# Patient Record
Sex: Female | Born: 1950 | Hispanic: No | Marital: Married | Smoking: Former smoker
Health system: Southern US, Community
[De-identification: ages and names within clinical notes are randomized; demographics above are authoritative.]

## PROBLEM LIST (undated history)

## (undated) DIAGNOSIS — I671 Cerebral aneurysm, nonruptured: Secondary | ICD-10-CM

## (undated) DIAGNOSIS — I10 Essential (primary) hypertension: Secondary | ICD-10-CM

## (undated) DIAGNOSIS — R011 Cardiac murmur, unspecified: Secondary | ICD-10-CM

## (undated) DIAGNOSIS — I059 Rheumatic mitral valve disease, unspecified: Secondary | ICD-10-CM

## (undated) DIAGNOSIS — G4730 Sleep apnea, unspecified: Secondary | ICD-10-CM

## (undated) HISTORY — PX: FRACTURE SURGERY: SHX138

## (undated) HISTORY — PX: OTHER SURGICAL HISTORY: SHX169

## (undated) HISTORY — DX: Essential (primary) hypertension: I10

## (undated) HISTORY — DX: Rheumatic mitral valve disease, unspecified: I05.9

## (undated) HISTORY — DX: Cerebral aneurysm, nonruptured: I67.1

## (undated) HISTORY — DX: Cardiac murmur, unspecified: R01.1

## (undated) HISTORY — DX: Sleep apnea, unspecified: G47.30

---

## 1998-02-21 ENCOUNTER — Other Ambulatory Visit: Admission: RE | Admit: 1998-02-21 | Discharge: 1998-02-21 | Payer: Self-pay | Admitting: Obstetrics and Gynecology

## 2001-03-28 ENCOUNTER — Other Ambulatory Visit: Admission: RE | Admit: 2001-03-28 | Discharge: 2001-03-28 | Payer: Self-pay | Admitting: Obstetrics and Gynecology

## 2002-07-27 ENCOUNTER — Other Ambulatory Visit: Admission: RE | Admit: 2002-07-27 | Discharge: 2002-07-27 | Payer: Self-pay | Admitting: Obstetrics and Gynecology

## 2003-08-07 ENCOUNTER — Other Ambulatory Visit: Admission: RE | Admit: 2003-08-07 | Discharge: 2003-08-07 | Payer: Self-pay | Admitting: Obstetrics and Gynecology

## 2004-11-26 ENCOUNTER — Other Ambulatory Visit: Admission: RE | Admit: 2004-11-26 | Discharge: 2004-11-26 | Payer: Self-pay | Admitting: Obstetrics and Gynecology

## 2007-02-16 ENCOUNTER — Other Ambulatory Visit: Admission: RE | Admit: 2007-02-16 | Discharge: 2007-02-16 | Payer: Self-pay | Admitting: Obstetrics and Gynecology

## 2010-04-08 ENCOUNTER — Inpatient Hospital Stay: Payer: Self-pay | Admitting: Unknown Physician Specialty

## 2010-06-10 ENCOUNTER — Other Ambulatory Visit: Payer: Self-pay | Admitting: Surgery

## 2010-07-22 ENCOUNTER — Ambulatory Visit: Payer: Self-pay | Admitting: Unknown Physician Specialty

## 2010-07-24 ENCOUNTER — Ambulatory Visit: Payer: Self-pay | Admitting: Unknown Physician Specialty

## 2011-12-07 IMAGING — CR DG CHEST 2V
1 series · 2 of 2 positions shown · non-contrast
Comparison: none

REASON FOR EXAM: htn
COMMENTS:

PROCEDURE:     DXR - DXR CHEST PA (OR AP) AND LATERAL  - July 22, 2010  [DATE]
RESULT:     The lung fields are clear. The heart, mediastinal and osseous
structures show no significant abnormalities.

[Series 1: view not recorded · 0.17mm/px · 2 of 2 slices shown]
[im 1/2]
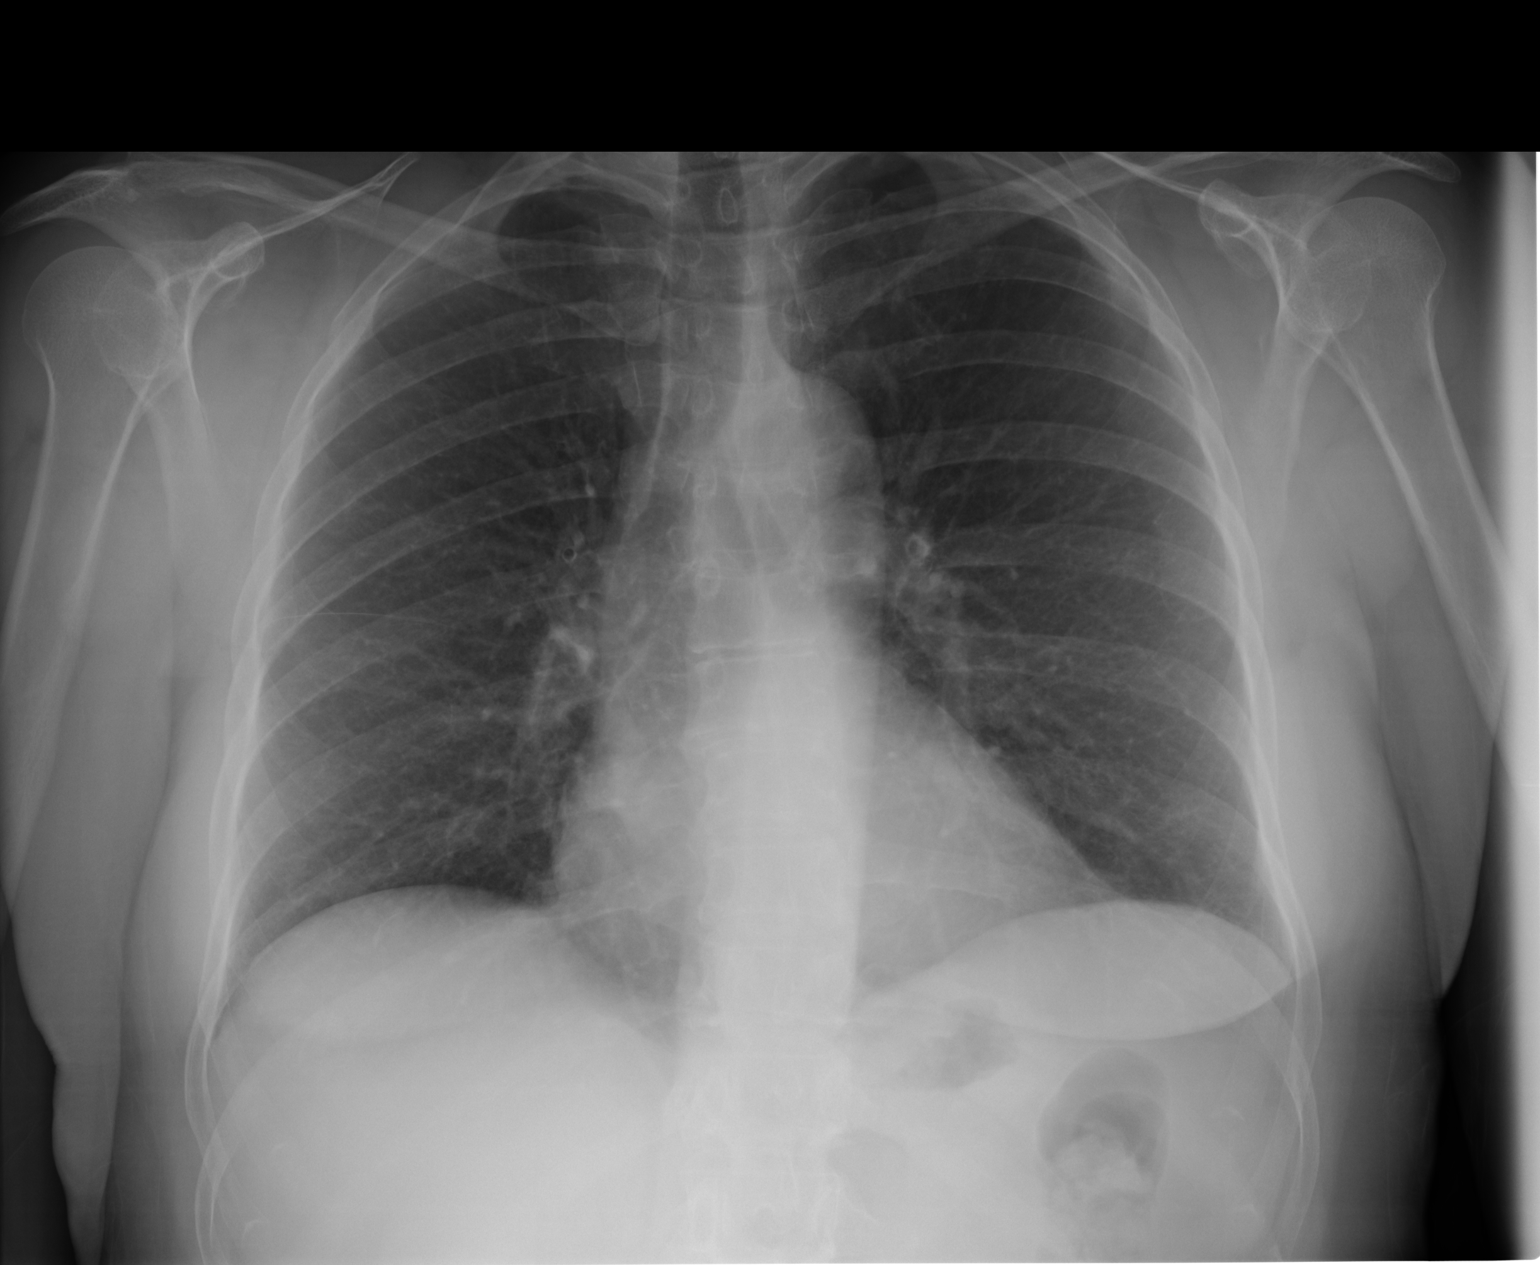
[im 2/2]
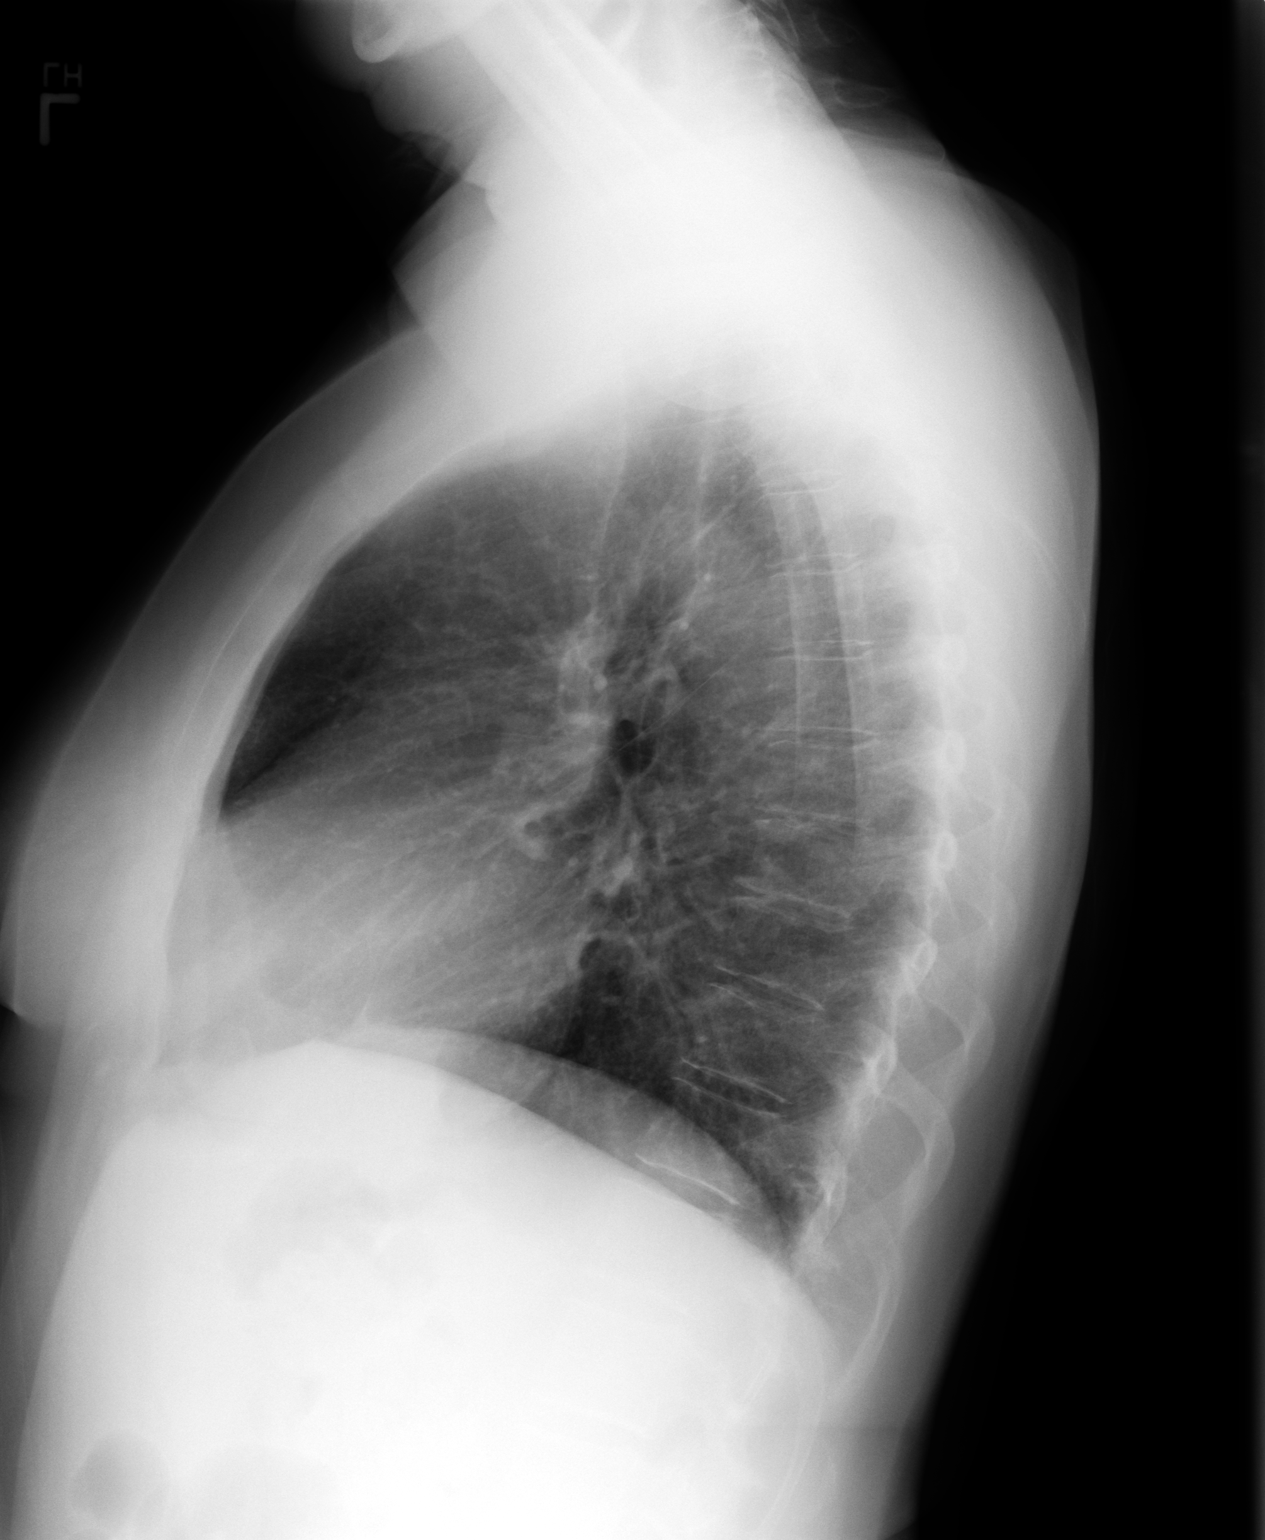

[2 of 2 positions shown; findings below may reference images not displayed]

IMPRESSION: No significant abnormalities are noted.

## 2015-01-23 DIAGNOSIS — I341 Nonrheumatic mitral (valve) prolapse: Secondary | ICD-10-CM | POA: Insufficient documentation

## 2015-01-23 DIAGNOSIS — I10 Essential (primary) hypertension: Secondary | ICD-10-CM | POA: Insufficient documentation

## 2015-01-28 DIAGNOSIS — D126 Benign neoplasm of colon, unspecified: Secondary | ICD-10-CM | POA: Insufficient documentation

## 2015-04-15 ENCOUNTER — Encounter: Admission: RE | Payer: Self-pay | Source: Ambulatory Visit

## 2015-04-15 ENCOUNTER — Ambulatory Visit
Admission: RE | Admit: 2015-04-15 | Payer: No Typology Code available for payment source | Source: Ambulatory Visit | Admitting: Unknown Physician Specialty

## 2015-04-15 SURGERY — COLONOSCOPY WITH PROPOFOL
Anesthesia: General

## 2018-09-08 DIAGNOSIS — C4492 Squamous cell carcinoma of skin, unspecified: Secondary | ICD-10-CM

## 2018-09-08 DIAGNOSIS — C4490 Unspecified malignant neoplasm of skin, unspecified: Secondary | ICD-10-CM

## 2018-09-08 HISTORY — DX: Squamous cell carcinoma of skin, unspecified: C44.92

## 2018-09-08 HISTORY — DX: Unspecified malignant neoplasm of skin, unspecified: C44.90

## 2019-07-24 ENCOUNTER — Other Ambulatory Visit: Payer: Self-pay

## 2019-07-24 ENCOUNTER — Encounter: Payer: Self-pay | Admitting: Dermatology

## 2019-07-24 ENCOUNTER — Ambulatory Visit: Payer: Medicare Other | Admitting: Dermatology

## 2019-07-24 DIAGNOSIS — Z85828 Personal history of other malignant neoplasm of skin: Secondary | ICD-10-CM | POA: Diagnosis not present

## 2019-07-24 DIAGNOSIS — L578 Other skin changes due to chronic exposure to nonionizing radiation: Secondary | ICD-10-CM | POA: Diagnosis not present

## 2019-07-24 DIAGNOSIS — L570 Actinic keratosis: Secondary | ICD-10-CM

## 2019-07-24 DIAGNOSIS — L814 Other melanin hyperpigmentation: Secondary | ICD-10-CM

## 2019-07-24 DIAGNOSIS — L820 Inflamed seborrheic keratosis: Secondary | ICD-10-CM | POA: Diagnosis not present

## 2019-07-24 DIAGNOSIS — L821 Other seborrheic keratosis: Secondary | ICD-10-CM | POA: Diagnosis not present

## 2019-07-24 NOTE — Progress Notes (Signed)
   Follow-Up Visit   Subjective  Diana Hart is a 69 y.o. female who presents for the following: recheck AK's (face, hands, chest - pt thinks she may still have lesions on her face) and recheck ISK's (face, hands, legs, arms - hands have improved per pt).  The following portions of the chart were reviewed this encounter and updated as appropriate:     Review of Systems: No other skin or systemic complaints.  Objective  Well appearing patient in no apparent distress; mood and affect are within normal limits.  A focused examination was performed including face, chest, arms, and hands. Relevant physical exam findings are noted in the Assessment and Plan.  Objective  L mandible x 1, R hand x 5, chest x 1, legs x 10 (17): Erythematous thin papules/macules with gritty scale.   Objective  Face, extremities: Diffuse scaly erythematous macules with underlying dyspigmentation.   Objective  L ant distal thigh: Well healed scar with no evidence of recurrence, no lymphadenopathy.   Objective  R sup forehead x 1, L temple x 1, L arm and hand x 14, R forearm x 4, chest x 4 (24): Erythematous keratotic or waxy stuck-on papule or plaque.   Objective  Face, trunk, extremities: Scattered tan macules.   Objective  Face, trunk, extremities: Stuck-on, waxy, tan-brown papule or plaque --Discussed benign etiology and prognosis.   Assessment & Plan  AK (actinic keratosis) (17) L mandible x 1, R hand x 5, chest x 1, legs x 10  Destruction of lesion - L mandible x 1, R hand x 5, chest x 1, legs x 10 Complexity: simple   Destruction method: cryotherapy   Informed consent: discussed and consent obtained   Timeout:  patient name, date of birth, surgical site, and procedure verified Lesion destroyed using liquid nitrogen: Yes   Region frozen until ice ball extended beyond lesion: Yes   Outcome: patient tolerated procedure well with no complications   Post-procedure details: wound care  instructions given    Actinic skin damage Face, extremities  Recommend broad spectrum SPF and photoprotection   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 Observe for recurrence.  Recommend regular skin exams, sunscreen use, and photoprotection.   Inflamed seborrheic keratosis (24) R sup forehead x 1, L temple x 1, L arm and hand x 14, R forearm x 4, chest x 4  Destruction of lesion - R sup forehead x 1, L temple x 1, L arm and hand x 14, R forearm x 4, chest x 4 Complexity: simple   Destruction method: cryotherapy   Informed consent: discussed and consent obtained   Timeout:  patient name, date of birth, surgical site, and procedure verified Lesion destroyed using liquid nitrogen: Yes   Region frozen until ice ball extended beyond lesion: Yes   Outcome: patient tolerated procedure well with no complications   Post-procedure details: wound care instructions given    Lentigines Face, trunk, extremities  Benign, observe.  Seborrheic keratosis Face, trunk, extremities  Benign, observe.  Return in about 6 months (around 01/24/2020) for F/U appt. - recheck AK's and ISK's.   Tanja Port, MD, am acting as scribe for Sarina Ser, MD .

## 2019-07-24 NOTE — Progress Notes (Signed)
   Follow-Up Visit   Subjective  Diana Hart is a 69 y.o. female who presents for the following: recheck AK's (face, hands, chest - pt thinks she may still have lesions on her face) and recheck ISK's (face, hands, legs, arms - hands have improved per pt).  The following portions of the chart were reviewed this encounter and updated as appropriate:     Review of Systems: No other skin or systemic complaints.  Objective  Well appearing patient in no apparent distress; mood and affect are within normal limits.  A focused examination was performed including face, arms, hands, chest, and legs. Relevant physical exam findings are noted in the Assessment and Plan.  Objective  L mandible x 1, R hand x 5, chest x 1, legs x 10 (17): Erythematous thin papules/macules with gritty scale.   Objective  Face, extremities: Diffuse scaly erythematous macules with underlying dyspigmentation.   Objective  L ant distal thigh: Well healed scar with no evidence of recurrence, no lymphadenopathy.   Objective  R sup forehead x 1, L temple x 1, L arm and hand x 14, R forearm x 4, chest x 4 (24): Erythematous keratotic or waxy stuck-on papule or plaque.   Objective  Face, trunk, extremities: Scattered tan macules.   Objective  Face, trunk, extremities: Stuck-on, waxy, tan-brown papule or plaque --Discussed benign etiology and prognosis.   Assessment & Plan  AK (actinic keratosis) (17) L mandible x 1, R hand x 5, chest x 1, legs x 10  Destruction of lesion - L mandible x 1, R hand x 5, chest x 1, legs x 10 Complexity: simple   Destruction method: cryotherapy   Informed consent: discussed and consent obtained   Timeout:  patient name, date of birth, surgical site, and procedure verified Lesion destroyed using liquid nitrogen: Yes   Region frozen until ice ball extended beyond lesion: Yes   Outcome: patient tolerated procedure well with no complications   Post-procedure details: wound care  instructions given    Actinic skin damage Face, extremities  Recommend broad spectrum SPF and photoprotection   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 Observe for recurrence.  Recommend regular skin exams, sunscreen use, and photoprotection.   Inflamed seborrheic keratosis (24) R sup forehead x 1, L temple x 1, L arm and hand x 14, R forearm x 4, chest x 4  Destruction of lesion - R sup forehead x 1, L temple x 1, L arm and hand x 14, R forearm x 4, chest x 4 Complexity: simple   Destruction method: cryotherapy   Informed consent: discussed and consent obtained   Timeout:  patient name, date of birth, surgical site, and procedure verified Lesion destroyed using liquid nitrogen: Yes   Region frozen until ice ball extended beyond lesion: Yes   Outcome: patient tolerated procedure well with no complications   Post-procedure details: wound care instructions given    Lentigines Face, trunk, extremities  Benign, observe.  Seborrheic keratosis Face, trunk, extremities  Benign, observe.  Return in about 6 months (around 01/24/2020) for F/U appt. - recheck AK's and ISK's.   Tanya Nones, CMA, am acting as scribe for Sarina Ser, MD .

## 2020-01-17 ENCOUNTER — Ambulatory Visit: Payer: Medicare Other | Admitting: Dermatology

## 2020-04-03 ENCOUNTER — Other Ambulatory Visit: Payer: Self-pay

## 2020-04-03 ENCOUNTER — Ambulatory Visit: Payer: Medicare Other | Admitting: Dermatology

## 2020-04-03 DIAGNOSIS — L570 Actinic keratosis: Secondary | ICD-10-CM | POA: Diagnosis not present

## 2020-04-03 DIAGNOSIS — L820 Inflamed seborrheic keratosis: Secondary | ICD-10-CM

## 2020-04-03 DIAGNOSIS — L720 Epidermal cyst: Secondary | ICD-10-CM | POA: Diagnosis not present

## 2020-04-03 DIAGNOSIS — L821 Other seborrheic keratosis: Secondary | ICD-10-CM | POA: Diagnosis not present

## 2020-04-03 DIAGNOSIS — L578 Other skin changes due to chronic exposure to nonionizing radiation: Secondary | ICD-10-CM | POA: Diagnosis not present

## 2020-04-03 NOTE — Progress Notes (Signed)
   Follow-Up Visit   Subjective  Diana Hart is a 69 y.o. female who presents for the following: recheck AK (x 17 on the L mandible, R hand, chest, and legs - check for any persistent skin lesions) and recheck ISK (x 24 on the R sup forehead, L temple, L arm and hand, R forearm, and chest - check for any persistent skin lesions).  The following portions of the chart were reviewed this encounter and updated as appropriate:  Allergies  Meds  Problems  Med Hx  Surg Hx  Fam Hx     Review of Systems:  No other skin or systemic complaints except as noted in HPI or Assessment and Plan.  Objective  Well appearing patient in no apparent distress; mood and affect are within normal limits.  A focused examination was performed including face, neck, chest and back and the trunk and extremities. Relevant physical exam findings are noted in the Assessment and Plan.  Objective  Face and hands x 16 (16): Erythematous thin papules/macules with gritty scale.   Objective  Back x 2 (2): Erythematous keratotic or waxy stuck-on papule or plaque.   Objective  Face: Smooth white papule(s).   Assessment & Plan  AK (actinic keratosis) (16) Face and hands x 16  Destruction of lesion - Face and hands x 16 Complexity: simple   Destruction method: cryotherapy   Informed consent: discussed and consent obtained   Timeout:  patient name, date of birth, surgical site, and procedure verified Lesion destroyed using liquid nitrogen: Yes   Region frozen until ice ball extended beyond lesion: Yes   Outcome: patient tolerated procedure well with no complications   Post-procedure details: wound care instructions given    Inflamed seborrheic keratosis (2) Back x 2  Destruction of lesion - Back x 2 Complexity: simple   Destruction method: cryotherapy   Informed consent: discussed and consent obtained   Timeout:  patient name, date of birth, surgical site, and procedure verified Lesion destroyed using  liquid nitrogen: Yes   Region frozen until ice ball extended beyond lesion: Yes   Outcome: patient tolerated procedure well with no complications   Post-procedure details: wound care instructions given    Milia Face  Samples given of Altreno lotion and Epiduo Forte - apply to aa's QHS.    Seborrheic Keratoses - Stuck-on, waxy, tan-brown papules and plaques  - Discussed benign etiology and prognosis. - Observe - Call for any changes  Actinic Damage - chronic, secondary to cumulative UV radiation exposure/sun exposure over time - diffuse scaly erythematous macules with underlying dyspigmentation - Recommend daily broad spectrum sunscreen SPF 30+ to sun-exposed areas, reapply every 2 hours as needed.  - Call for new or changing lesions.  Return in about 6 months (around 10/02/2020) for AK and ISK recheck .  Luther Redo, CMA, am acting as scribe for Sarina Ser, MD .  Documentation: I have reviewed the above documentation for accuracy and completeness, and I agree with the above.  Sarina Ser, MD

## 2020-04-08 ENCOUNTER — Encounter: Payer: Self-pay | Admitting: Dermatology

## 2020-10-10 ENCOUNTER — Ambulatory Visit: Payer: Medicare Other | Admitting: Dermatology

## 2021-01-29 ENCOUNTER — Other Ambulatory Visit: Payer: Self-pay

## 2021-01-29 ENCOUNTER — Ambulatory Visit: Payer: Medicare Other | Admitting: Dermatology

## 2021-01-29 DIAGNOSIS — L820 Inflamed seborrheic keratosis: Secondary | ICD-10-CM

## 2021-01-29 DIAGNOSIS — L570 Actinic keratosis: Secondary | ICD-10-CM | POA: Diagnosis not present

## 2021-01-29 DIAGNOSIS — L578 Other skin changes due to chronic exposure to nonionizing radiation: Secondary | ICD-10-CM | POA: Diagnosis not present

## 2021-01-29 MED ORDER — FLUOROURACIL 5 % EX CREA: TOPICAL_CREAM | Freq: Two times a day (BID) | CUTANEOUS | 1 refills | Status: DC

## 2021-01-29 NOTE — Patient Instructions (Addendum)

## 2021-01-29 NOTE — Progress Notes (Signed)
Follow-Up Visit   Subjective  Diana Hart is a 70 y.o. female who presents for the following: Actinic Keratosis (9 month follow up of face and hands treated with LN2).  The following portions of the chart were reviewed this encounter and updated as appropriate:   Allergies  Meds  Problems  Med Hx  Surg Hx  Fam Hx     Review of Systems:  No other skin or systemic complaints except as noted in HPI or Assessment and Plan.  Objective  Well appearing patient in no apparent distress; mood and affect are within normal limits.  A focused examination was performed including face, arms. Relevant physical exam findings are noted in the Assessment and Plan.  Right face x 4, right arm/hand x 11, left arm/hand x 5 (20) Erythematous thin papules/macules with gritty scale.   Left arm x 2 (2) Erythematous keratotic or waxy stuck-on papule or plaque.    Assessment & Plan   AK (actinic keratosis) (20) Right face x 4, right arm/hand x 11, left arm/hand x 5  Actinic Damage - Severe, confluent actinic changes with pre-cancerous actinic keratoses  - Severe, chronic, not at goal, secondary to cumulative UV radiation exposure over time - diffuse scaly erythematous macules and papules with underlying dyspigmentation - Discussed Prescription "Field Treatment" for Severe, Chronic Confluent Actinic Changes with Pre-Cancerous Actinic Keratoses Field treatment involves treatment of an entire area of skin that has confluent Actinic Changes (Sun/ Ultraviolet light damage) and PreCancerous Actinic Keratoses by method of PhotoDynamic Therapy (PDT) and/or prescription Topical Chemotherapy agents such as 5-fluorouracil, 5-fluorouracil/calcipotriene, and/or imiquimod.  The purpose is to decrease the number of clinically evident and subclinical PreCancerous lesions to prevent progression to development of skin cancer by chemically destroying early precancer changes that may or may not be visible.  It has been  shown to reduce the risk of developing skin cancer in the treated area. As a result of treatment, redness, scaling, crusting, and open sores may occur during treatment course. One or more than one of these methods may be used and may have to be used several times to control, suppress and eliminate the PreCancerous changes. Discussed treatment course, expected reaction, and possible side effects. - Recommend daily broad spectrum sunscreen SPF 30+ to sun-exposed areas, reapply every 2 hours as needed.  - Staying in the shade or wearing long sleeves, sun glasses (UVA+UVB protection) and wide brim hats (4-inch brim around the entire circumference of the hat) are also recommended. - Call for new or changing lesions.  Start Fluorouracil 5% mixed with Calcipotriene cream bid x 7 days - sent to [facility] of lesion - Right face x 4, right arm/hand x 11, left arm/hand x 5 Complexity: simple   Destruction method: cryotherapy   Informed consent: discussed and consent obtained   Timeout:  patient name, date of birth, surgical site, and procedure verified Lesion destroyed using liquid nitrogen: Yes   Region frozen until ice ball extended beyond lesion: Yes   Outcome: patient tolerated procedure well with no complications   Post-procedure details: wound care instructions given    fluorouracil (EFUDEX) 5 % cream - Right face x 4, right arm/hand x 11, left arm/hand x 5 Apply topically 2 (two) times daily.  Inflamed seborrheic keratosis Left arm x 2  Destruction of lesion - Left arm x 2 Complexity: simple   Destruction method: cryotherapy   Informed consent: discussed and consent obtained   Timeout:  patient name, date of  birth, surgical site, and procedure verified Lesion destroyed using liquid nitrogen: Yes   Region frozen until ice ball extended beyond lesion: Yes   Outcome: patient tolerated procedure well with no complications   Post-procedure details: wound care instructions  given    Return in about 6 months (around 07/29/2021).  I, Ashok Cordia, CMA, am acting as scribe for Sarina Ser, MD . Documentation: I have reviewed the above documentation for accuracy and completeness, and I agree with the above.  Sarina Ser, MD

## 2021-01-31 ENCOUNTER — Encounter: Payer: Self-pay | Admitting: Dermatology

## 2021-08-06 ENCOUNTER — Ambulatory Visit: Payer: Medicare Other | Admitting: Dermatology

## 2021-12-11 ENCOUNTER — Ambulatory Visit: Payer: Medicare Other | Admitting: Dermatology

## 2021-12-22 ENCOUNTER — Ambulatory Visit: Payer: Medicare Other | Admitting: Dermatology

## 2021-12-22 DIAGNOSIS — L570 Actinic keratosis: Secondary | ICD-10-CM

## 2021-12-22 DIAGNOSIS — L578 Other skin changes due to chronic exposure to nonionizing radiation: Secondary | ICD-10-CM

## 2021-12-22 DIAGNOSIS — Z79899 Other long term (current) drug therapy: Secondary | ICD-10-CM

## 2021-12-22 DIAGNOSIS — D229 Melanocytic nevi, unspecified: Secondary | ICD-10-CM

## 2021-12-22 DIAGNOSIS — Z1283 Encounter for screening for malignant neoplasm of skin: Secondary | ICD-10-CM | POA: Diagnosis not present

## 2021-12-22 DIAGNOSIS — D1800 Hemangioma unspecified site: Secondary | ICD-10-CM

## 2021-12-22 DIAGNOSIS — Z8589 Personal history of malignant neoplasm of other organs and systems: Secondary | ICD-10-CM

## 2021-12-22 DIAGNOSIS — L821 Other seborrheic keratosis: Secondary | ICD-10-CM

## 2021-12-22 DIAGNOSIS — Z85828 Personal history of other malignant neoplasm of skin: Secondary | ICD-10-CM | POA: Diagnosis not present

## 2021-12-22 DIAGNOSIS — C44729 Squamous cell carcinoma of skin of left lower limb, including hip: Secondary | ICD-10-CM

## 2021-12-22 DIAGNOSIS — D485 Neoplasm of uncertain behavior of skin: Secondary | ICD-10-CM

## 2021-12-22 DIAGNOSIS — L814 Other melanin hyperpigmentation: Secondary | ICD-10-CM

## 2021-12-22 NOTE — Patient Instructions (Addendum)
Wound Care Instructions  Cleanse wound gently with soap and water once a day then pat dry with clean gauze. Apply a thin coat of Petrolatum (petroleum jelly, "Vaseline") over the wound (unless you have an allergy to this). We recommend that you use a new, sterile tube of Vaseline. Do not pick or remove scabs. Do not remove the yellow or white "healing tissue" from the base of the wound.  Cover the wound with fresh, clean, nonstick gauze and secure with paper tape. You may use Band-Aids in place of gauze and tape if the wound is small enough, but would recommend trimming much of the tape off as there is often too much. Sometimes Band-Aids can irritate the skin.  You should call the office for your biopsy report after 1 week if you have not already been contacted.  If you experience any problems, such as abnormal amounts of bleeding, swelling, significant bruising, significant pain, or evidence of infection, please call the office immediately.  FOR ADULT SURGERY PATIENTS: If you need something for pain relief you may take 1 extra strength Tylenol (acetaminophen) AND 2 Ibuprofen (200mg each) together every 4 hours as needed for pain. (do not take these if you are allergic to them or if you have a reason you should not take them.) Typically, you may only need pain medication for 1 to 3 days.     Due to recent changes in healthcare laws, you may see results of your pathology and/or laboratory studies on MyChart before the doctors have had a chance to review them. We understand that in some cases there may be results that are confusing or concerning to you. Please understand that not all results are received at the same time and often the doctors may need to interpret multiple results in order to provide you with the best plan of care or course of treatment. Therefore, we ask that you please give us 2 business days to thoroughly review all your results before contacting the office for clarification. Should  we see a critical lab result, you will be contacted sooner.   If You Need Anything After Your Visit  If you have any questions or concerns for your doctor, please call our main line at [phone] and press option 4 to reach your doctor's medical assistant. If no one answers, please leave a voicemail as directed and we will return your call as soon as possible. Messages left after 4 pm will be answered the following business day.   You may also send us a message via MyChart. We typically respond to MyChart messages within 1-2 business days.  For prescription refills, please ask your pharmacy to contact our office. Our fax number is [phone].  If you have an urgent issue when the clinic is closed that cannot wait until the next business day, you can page your doctor at the number below.    Please note that while we do our best to be available for urgent issues outside of office hours, we are not available 24/7.   If you have an urgent issue and are unable to reach us, you may choose to seek medical care at your doctor's office, retail clinic, urgent care center, or emergency room.  If you have a medical emergency, please immediately call 911 or go to the emergency department.  Pager Numbers  - Dr. Kowalski: [phone]  - Dr. Moye: [phone]  - Dr. Stewart: [phone]  In the event of inclement weather, please call our main line at   [phone] for an update on the status of any delays or closures.  Dermatology Medication Tips: Please keep the boxes that topical medications come in in order to help keep track of the instructions about where and how to use these. Pharmacies typically print the medication instructions only on the boxes and not directly on the medication tubes.   If your medication is too expensive, please contact our office at [phone] option 4 or send us a message through MyChart.   We are unable to tell what your co-pay for medications will be in  advance as this is different depending on your insurance coverage. However, we may be able to find a substitute medication at lower cost or fill out paperwork to get insurance to cover a needed medication.   If a prior authorization is required to get your medication covered by your insurance company, please allow us 1-2 business days to complete this process.  Drug prices often vary depending on where the prescription is filled and some pharmacies may offer cheaper prices.  The website www.goodrx.com contains coupons for medications through different pharmacies. The prices here do not account for what the cost may be with help from insurance (it may be cheaper with your insurance), but the website can give you the price if you did not use any insurance.  - You can print the associated coupon and take it with your prescription to the pharmacy.  - You may also stop by our office during regular business hours and pick up a GoodRx coupon card.  - If you need your prescription sent electronically to a different pharmacy, notify our office through [facility] MyChart or by phone at [phone] option 4.     Si Usted Necesita Algo Despus de Su Visita  Tambin puede enviarnos un mensaje a travs de MyChart. Por lo general respondemos a los mensajes de MyChart en el transcurso de 1 a 2 das hbiles.  Para renovar recetas, por favor pida a su farmacia que se ponga en contacto con nuestra oficina. Nuestro nmero de fax es el [phone].  Si tiene un asunto urgente cuando la clnica est cerrada y que no puede esperar hasta el siguiente da hbil, puede llamar/localizar a su doctor(a) al nmero que aparece a continuacin.   Por favor, tenga en cuenta que aunque hacemos todo lo posible para estar disponibles para asuntos urgentes fuera del horario de oficina, no estamos disponibles las 24 horas del da, los 7 das de la semana.   Si tiene un problema urgente y no puede comunicarse con nosotros, puede  optar por buscar atencin mdica  en el consultorio de su doctor(a), en una clnica privada, en un centro de atencin urgente o en una sala de emergencias.  Si tiene una emergencia mdica, por favor llame inmediatamente al 911 o vaya a la sala de emergencias.  Nmeros de bper  - Dr. Kowalski: [phone]  - Dra. Moye: [phone]  - Dra. Stewart: [phone]  En caso de inclemencias del tiempo, por favor llame a nuestra lnea principal al [phone] para una actualizacin sobre el estado de cualquier retraso o cierre.  Consejos para la medicacin en dermatologa: Por favor, guarde las cajas en las que vienen los medicamentos de uso tpico para ayudarle a seguir las instrucciones sobre dnde y cmo usarlos. Las farmacias generalmente imprimen las instrucciones del medicamento slo en las cajas y no directamente en los tubos del medicamento.   Si su medicamento es muy caro, por favor, pngase en contacto con   nuestra oficina llamando al [phone] y presione la opcin 4 o envenos un mensaje a travs de MyChart.   No podemos decirle cul ser su copago por los medicamentos por adelantado ya que esto es diferente dependiendo de la cobertura de su seguro. Sin embargo, es posible que podamos encontrar un medicamento sustituto a menor costo o llenar un formulario para que el seguro cubra el medicamento que se considera necesario.   Si se requiere una autorizacin previa para que su compaa de seguros cubra su medicamento, por favor permtanos de 1 a 2 das hbiles para completar este proceso.  Los precios de los medicamentos varan con frecuencia dependiendo del lugar de dnde se surte la receta y alguna farmacias pueden ofrecer precios ms baratos.  El sitio web www.goodrx.com tiene cupones para medicamentos de diferentes farmacias. Los precios aqu no tienen en cuenta lo que podra costar con la ayuda del seguro (puede ser ms barato con su seguro), pero el sitio web puede darle el  precio si no utiliz ningn seguro.  - Puede imprimir el cupn correspondiente y llevarlo con su receta a la farmacia.  - Tambin puede pasar por nuestra oficina durante el horario de atencin regular y recoger una tarjeta de cupones de GoodRx.  - Si necesita que su receta se enve electrnicamente a una farmacia diferente, informe a nuestra oficina a travs de MyChart de [facility] o por telfono llamando al [phone] y presione la opcin 4.  

## 2021-12-22 NOTE — Progress Notes (Signed)
Follow-Up Visit   Subjective  Diana Hart is a 71 y.o. female who presents for the following: Annual Exam (Hx AK's, SCC). The patient presents for Total-Body Skin Exam (TBSE) for skin cancer screening and mole check.  The patient has spots, moles and lesions to be evaluated, some may be new or changing.  The following portions of the chart were reviewed this encounter and updated as appropriate:   Tobacco  Allergies  Meds  Problems  Med Hx  Surg Hx  Fam Hx     Review of Systems:  No other skin or systemic complaints except as noted in HPI or Assessment and Plan.  Objective  Well appearing patient in no apparent distress; mood and affect are within normal limits.  A full examination was performed including scalp, head, eyes, ears, nose, lips, neck, chest, axillae, abdomen, back, buttocks, bilateral upper extremities, bilateral lower extremities, hands, feet, fingers, toes, fingernails, and toenails. All findings within normal limits unless otherwise noted below.  L temple, hands, ears x 17 (17) Erythematous thin papules/macules with gritty scale.   L mid med calf 0.5 cm firm pink papule.   Assessment & Plan  AK (actinic keratosis) (17) L temple, hands, ears x 17  Destruction of lesion - L temple, hands, ears x 17 Complexity: simple   Destruction method: cryotherapy   Informed consent: discussed and consent obtained   Timeout:  patient name, date of birth, surgical site, and procedure verified Lesion destroyed using liquid nitrogen: Yes   Region frozen until ice ball extended beyond lesion: Yes   Outcome: patient tolerated procedure well with no complications   Post-procedure details: wound care instructions given    Actinic Damage - Severe, confluent actinic changes with pre-cancerous actinic keratoses  - Severe, chronic, not at goal, secondary to cumulative UV radiation exposure over time - diffuse scaly erythematous macules and papules with underlying  dyspigmentation - Discussed Prescription "Field Treatment" for Severe, Chronic Confluent Actinic Changes with Pre-Cancerous Actinic Keratoses Field treatment involves treatment of an entire area of skin that has confluent Actinic Changes (Sun/ Ultraviolet light damage) and PreCancerous Actinic Keratoses by method of PhotoDynamic Therapy (PDT) and/or prescription Topical Chemotherapy agents such as 5-fluorouracil, 5-fluorouracil/calcipotriene, and/or imiquimod.  The purpose is to decrease the number of clinically evident and subclinical PreCancerous lesions to prevent progression to development of skin cancer by chemically destroying early precancer changes that may or may not be visible.  It has been shown to reduce the risk of developing skin cancer in the treated area. As a result of treatment, redness, scaling, crusting, and open sores may occur during treatment course. One or more than one of these methods may be used and may have to be used several times to control, suppress and eliminate the PreCancerous changes. Discussed treatment course, expected reaction, and possible side effects. - Recommend daily broad spectrum sunscreen SPF 30+ to sun-exposed areas, reapply every 2 hours as needed.  - Staying in the shade or wearing long sleeves, sun glasses (UVA+UVB protection) and wide brim hats (4-inch brim around the entire circumference of the hat) are also recommended. - Call for new or changing lesions. 5-FU/Calcipotriene bid arms and hands this Fall.  Neoplasm of uncertain behavior of skin L mid med calf Epidermal / dermal shaving  Lesion diameter (cm):  0.5 Informed consent: discussed and consent obtained   Timeout: patient name, date of birth, surgical site, and procedure verified   Procedure prep:  Patient was prepped and draped in usual  sterile fashion Prep type:  Isopropyl alcohol Anesthesia: the lesion was anesthetized in a standard fashion   Anesthetic:  1% lidocaine w/ epinephrine  1-100,000 buffered w/ 8.4% NaHCO3 Instrument used: flexible razor blade   Hemostasis achieved with: pressure, aluminum chloride and electrodesiccation   Outcome: patient tolerated procedure well   Post-procedure details: sterile dressing applied and wound care instructions given   Dressing type: bandage and petrolatum    Specimen 1 - Surgical pathology Differential Diagnosis: D48.5 r/o dermatofibroma Check Margins: No  Lentigines - Scattered tan macules - Due to sun exposure - Benign-appearing, observe - Recommend daily broad spectrum sunscreen SPF 30+ to sun-exposed areas, reapply every 2 hours as needed. - Call for any changes  Seborrheic Keratoses - Stuck-on, waxy, tan-brown papules and/or plaques  - Benign-appearing - Discussed benign etiology and prognosis. - Observe - Call for any changes  Melanocytic Nevi - Tan-brown and/or pink-flesh-colored symmetric macules and papules - Benign appearing on exam today - Observation - Call clinic for new or changing moles - Recommend daily use of broad spectrum spf 30+ sunscreen to sun-exposed areas.   Hemangiomas - Red papules - Discussed benign nature - Observe - Call for any changes  Actinic Damage - Chronic condition, secondary to cumulative UV/sun exposure - diffuse scaly erythematous macules with underlying dyspigmentation - Recommend daily broad spectrum sunscreen SPF 30+ to sun-exposed areas, reapply every 2 hours as needed.  - Staying in the shade or wearing long sleeves, sun glasses (UVA+UVB protection) and wide brim hats (4-inch brim around the entire circumference of the hat) are also recommended for sun protection.  - Call for new or changing lesions.  History of Squamous Cell Carcinoma of the Skin - No evidence of recurrence today - No lymphadenopathy - Recommend regular full body skin exams - Recommend daily broad spectrum sunscreen SPF 30+ to sun-exposed areas, reapply every 2 hours as needed.  - Call if any  new or changing lesions are noted between office visits  Skin cancer screening performed today.  Return in about 6 months (around 06/24/2022) for Ak follow up .  Luther Redo, CMA, am acting as scribe for Sarina Ser, MD . Documentation: I have reviewed the above documentation for accuracy and completeness, and I agree with the above.  Sarina Ser, MD

## 2021-12-24 ENCOUNTER — Telehealth: Payer: Self-pay

## 2021-12-24 NOTE — Telephone Encounter (Signed)
Advised patient of results and scheduled for EDC/hd

## 2021-12-24 NOTE — Telephone Encounter (Signed)
-----   Message from Ralene Bathe, MD sent at 12/24/2021  5:11 PM EDT ----- Diagnosis Skin , left mid med calf WELL DIFFERENTIATED SQUAMOUS CELL CARCINOMA WITH SUPERFICIAL INFILTRATION, CLOSE TO MARGIN, SEE DESCRIPTION  Cancer - SCC Schedule for treatment (EDC)

## 2021-12-26 ENCOUNTER — Encounter: Payer: Self-pay | Admitting: Dermatology

## 2022-01-21 ENCOUNTER — Ambulatory Visit: Payer: Medicare Other | Admitting: Dermatology

## 2022-01-21 ENCOUNTER — Encounter: Payer: Self-pay | Admitting: Dermatology

## 2022-01-21 DIAGNOSIS — C44729 Squamous cell carcinoma of skin of left lower limb, including hip: Secondary | ICD-10-CM

## 2022-01-21 DIAGNOSIS — C4492 Squamous cell carcinoma of skin, unspecified: Secondary | ICD-10-CM

## 2022-01-21 NOTE — Progress Notes (Unsigned)
   Follow-Up Visit   Subjective  Diana Hart is a 71 y.o. female who presents for the following: Follow-up (Biopsy proven SCC left mid med calf- pt here for treatment ).  The following portions of the chart were reviewed this encounter and updated as appropriate:   Tobacco  Allergies  Meds  Problems  Med Hx  Surg Hx  Fam Hx     Review of Systems:  No other skin or systemic complaints except as noted in HPI or Assessment and Plan.  Objective  Well appearing patient in no apparent distress; mood and affect are within normal limits.  A focused examination was performed including left leg. Relevant physical exam findings are noted in the Assessment and Plan.  left mid med calf Keratotic pink papule/nodule or plaque.    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  Destruction of lesion  Destruction method: electrodesiccation and curettage   Informed consent: discussed and consent obtained   Timeout:  patient name, date of birth, surgical site, and procedure verified Anesthesia: the lesion was anesthetized in a standard fashion   Anesthetic:  1% lidocaine w/ epinephrine 1-100,000 buffered w/ 8.4% NaHCO3 Curettage performed in three different directions: Yes   Electrodesiccation performed over the curetted area: Yes   Curettage cycles:  3 Final wound size (cm):  1.1 Hemostasis achieved with:  electrodesiccation Outcome: patient tolerated procedure well with no complications   Post-procedure details: sterile dressing applied and wound care instructions given   Dressing type: petrolatum     Return for follow up in April 2024.  IMarye Round, CMA, am acting as scribe for Sarina Ser, MD .  Documentation: I have reviewed the above documentation for accuracy and completeness, and I agree with the above.  Sarina Ser, MD

## 2022-01-21 NOTE — Patient Instructions (Signed)
Wound Care Instructions  Cleanse wound gently with soap and water once a day then pat dry with clean gauze. Apply a thin coat of Petrolatum (petroleum jelly, "Vaseline") over the wound (unless you have an allergy to this). We recommend that you use a new, sterile tube of Vaseline. Do not pick or remove scabs. Do not remove the yellow or white "healing tissue" from the base of the wound.  Cover the wound with fresh, clean, nonstick gauze and secure with paper tape. You may use Band-Aids in place of gauze and tape if the wound is small enough, but would recommend trimming much of the tape off as there is often too much. Sometimes Band-Aids can irritate the skin.  You should call the office for your biopsy report after 1 week if you have not already been contacted.  If you experience any problems, such as abnormal amounts of bleeding, swelling, significant bruising, significant pain, or evidence of infection, please call the office immediately.  FOR ADULT SURGERY PATIENTS: If you need something for pain relief you may take 1 extra strength Tylenol (acetaminophen) AND 2 Ibuprofen (200mg each) together every 4 hours as needed for pain. (do not take these if you are allergic to them or if you have a reason you should not take them.) Typically, you may only need pain medication for 1 to 3 days.     Due to recent changes in healthcare laws, you may see results of your pathology and/or laboratory studies on MyChart before the doctors have had a chance to review them. We understand that in some cases there may be results that are confusing or concerning to you. Please understand that not all results are received at the same time and often the doctors may need to interpret multiple results in order to provide you with the best plan of care or course of treatment. Therefore, we ask that you please give us 2 business days to thoroughly review all your results before contacting the office for clarification. Should  we see a critical lab result, you will be contacted sooner.   If You Need Anything After Your Visit  If you have any questions or concerns for your doctor, please call our main line at [phone] and press option 4 to reach your doctor's medical assistant. If no one answers, please leave a voicemail as directed and we will return your call as soon as possible. Messages left after 4 pm will be answered the following business day.   You may also send us a message via MyChart. We typically respond to MyChart messages within 1-2 business days.  For prescription refills, please ask your pharmacy to contact our office. Our fax number is [phone].  If you have an urgent issue when the clinic is closed that cannot wait until the next business day, you can page your doctor at the number below.    Please note that while we do our best to be available for urgent issues outside of office hours, we are not available 24/7.   If you have an urgent issue and are unable to reach us, you may choose to seek medical care at your doctor's office, retail clinic, urgent care center, or emergency room.  If you have a medical emergency, please immediately call 911 or go to the emergency department.  Pager Numbers  - Dr. Kowalski: [phone]  - Dr. Moye: [phone]  - Dr. Stewart: [phone]  In the event of inclement weather, please call our main line at   [phone] for an update on the status of any delays or closures.  Dermatology Medication Tips: Please keep the boxes that topical medications come in in order to help keep track of the instructions about where and how to use these. Pharmacies typically print the medication instructions only on the boxes and not directly on the medication tubes.   If your medication is too expensive, please contact our office at [phone] option 4 or send us a message through MyChart.   We are unable to tell what your co-pay for medications will be in  advance as this is different depending on your insurance coverage. However, we may be able to find a substitute medication at lower cost or fill out paperwork to get insurance to cover a needed medication.   If a prior authorization is required to get your medication covered by your insurance company, please allow us 1-2 business days to complete this process.  Drug prices often vary depending on where the prescription is filled and some pharmacies may offer cheaper prices.  The website www.goodrx.com contains coupons for medications through different pharmacies. The prices here do not account for what the cost may be with help from insurance (it may be cheaper with your insurance), but the website can give you the price if you did not use any insurance.  - You can print the associated coupon and take it with your prescription to the pharmacy.  - You may also stop by our office during regular business hours and pick up a GoodRx coupon card.  - If you need your prescription sent electronically to a different pharmacy, notify our office through [facility] MyChart or by phone at [phone] option 4.     Si Usted Necesita Algo Despus de Su Visita  Tambin puede enviarnos un mensaje a travs de MyChart. Por lo general respondemos a los mensajes de MyChart en el transcurso de 1 a 2 das hbiles.  Para renovar recetas, por favor pida a su farmacia que se ponga en contacto con nuestra oficina. Nuestro nmero de fax es el [phone].  Si tiene un asunto urgente cuando la clnica est cerrada y que no puede esperar hasta el siguiente da hbil, puede llamar/localizar a su doctor(a) al nmero que aparece a continuacin.   Por favor, tenga en cuenta que aunque hacemos todo lo posible para estar disponibles para asuntos urgentes fuera del horario de oficina, no estamos disponibles las 24 horas del da, los 7 das de la semana.   Si tiene un problema urgente y no puede comunicarse con nosotros, puede  optar por buscar atencin mdica  en el consultorio de su doctor(a), en una clnica privada, en un centro de atencin urgente o en una sala de emergencias.  Si tiene una emergencia mdica, por favor llame inmediatamente al 911 o vaya a la sala de emergencias.  Nmeros de bper  - Dr. Kowalski: [phone]  - Dra. Moye: [phone]  - Dra. Stewart: [phone]  En caso de inclemencias del tiempo, por favor llame a nuestra lnea principal al [phone] para una actualizacin sobre el estado de cualquier retraso o cierre.  Consejos para la medicacin en dermatologa: Por favor, guarde las cajas en las que vienen los medicamentos de uso tpico para ayudarle a seguir las instrucciones sobre dnde y cmo usarlos. Las farmacias generalmente imprimen las instrucciones del medicamento slo en las cajas y no directamente en los tubos del medicamento.   Si su medicamento es muy caro, por favor, pngase en contacto con   nuestra oficina llamando al [phone] y presione la opcin 4 o envenos un mensaje a travs de MyChart.   No podemos decirle cul ser su copago por los medicamentos por adelantado ya que esto es diferente dependiendo de la cobertura de su seguro. Sin embargo, es posible que podamos encontrar un medicamento sustituto a menor costo o llenar un formulario para que el seguro cubra el medicamento que se considera necesario.   Si se requiere una autorizacin previa para que su compaa de seguros cubra su medicamento, por favor permtanos de 1 a 2 das hbiles para completar este proceso.  Los precios de los medicamentos varan con frecuencia dependiendo del lugar de dnde se surte la receta y alguna farmacias pueden ofrecer precios ms baratos.  El sitio web www.goodrx.com tiene cupones para medicamentos de diferentes farmacias. Los precios aqu no tienen en cuenta lo que podra costar con la ayuda del seguro (puede ser ms barato con su seguro), pero el sitio web puede darle el  precio si no utiliz ningn seguro.  - Puede imprimir el cupn correspondiente y llevarlo con su receta a la farmacia.  - Tambin puede pasar por nuestra oficina durante el horario de atencin regular y recoger una tarjeta de cupones de GoodRx.  - Si necesita que su receta se enve electrnicamente a una farmacia diferente, informe a nuestra oficina a travs de MyChart de [facility] o por telfono llamando al [phone] y presione la opcin 4.  

## 2022-01-22 ENCOUNTER — Encounter: Payer: Self-pay | Admitting: Dermatology

## 2022-07-30 ENCOUNTER — Telehealth: Payer: Self-pay | Admitting: Gastroenterology

## 2022-07-30 NOTE — Telephone Encounter (Signed)
Good afternoon Dr. Rush Landmark  The following patient was previously with [facility] and wants to come to [place] for her colonoscopy. She was only going to them after having a procedure done in a hospital with them. She advised her last procedure was 2019. Records are available in Epic with CareEverywhere. Please review and advise of scheduling  Thank you.

## 2022-07-30 NOTE — Telephone Encounter (Signed)
Unfortunately the 2019 colonoscopy report is not able to be viewed on care everywhere, only that it was done. Do you know if she had a recall for 5 years? Please obtain the EGD/colonoscopy that look to have been done around that time. Once we have this information or if she knows that she had a recall notice at 5 years, we can work on getting her scheduled for colonoscopy. Thanks. GM

## 2022-08-07 NOTE — Telephone Encounter (Signed)
Spoke to PT about recommendations. She will be faxing the reports

## 2022-08-10 ENCOUNTER — Ambulatory Visit: Payer: Medicare HMO | Admitting: Dermatology

## 2022-08-10 VITALS — BP 148/79 | HR 89

## 2022-08-10 DIAGNOSIS — L578 Other skin changes due to chronic exposure to nonionizing radiation: Secondary | ICD-10-CM

## 2022-08-10 DIAGNOSIS — L570 Actinic keratosis: Secondary | ICD-10-CM

## 2022-08-10 DIAGNOSIS — L719 Rosacea, unspecified: Secondary | ICD-10-CM

## 2022-08-10 DIAGNOSIS — Z7189 Other specified counseling: Secondary | ICD-10-CM | POA: Diagnosis not present

## 2022-08-10 DIAGNOSIS — L821 Other seborrheic keratosis: Secondary | ICD-10-CM | POA: Diagnosis not present

## 2022-08-10 DIAGNOSIS — L820 Inflamed seborrheic keratosis: Secondary | ICD-10-CM

## 2022-08-10 NOTE — Progress Notes (Signed)
Follow-Up Visit   Subjective  Diana Hart is a 72 y.o. female who presents for the following: 6 months f/u on precancers on the face,arms and hands. The patient has lesions to be evaluated, some may be new or changing.  Husband with patient   The following portions of the chart were reviewed this encounter and updated as appropriate: medications, allergies, medical history  Review of Systems:  No other skin or systemic complaints except as noted in HPI or Assessment and Plan.  Objective  Well appearing patient in no apparent distress; mood and affect are within normal limits. A focused examination was performed of the following areas: face,arms,hands  Relevant exam findings are noted in the Assessment and Plan.   Assessment & Plan   ACTINIC KERATOSIS Exam: Erythematous thin papules/macules with gritty scale  Actinic keratoses are precancerous spots that appear secondary to cumulative UV radiation exposure/sun exposure over time. They are chronic with expected duration over 1 year. A portion of actinic keratoses will progress to squamous cell carcinoma of the skin. It is not possible to reliably predict which spots will progress to skin cancer and so treatment is recommended to prevent development of skin cancer.  Recommend daily broad spectrum sunscreen SPF 30+ to sun-exposed areas, reapply every 2 hours as needed.  Recommend staying in the shade or wearing long sleeves, sun glasses (UVA+UVB protection) and wide brim hats (4-inch brim around the entire circumference of the hat). Call for new or changing lesions.  Treatment Plan:  Prior to procedure, discussed risks of blister formation, small wound, skin dyspigmentation, or rare scar following cryotherapy. Recommend Vaseline ointment to treated areas while healing.  Destruction Procedure Note Destruction method: cryotherapy   Informed consent: discussed and consent obtained   Lesion destroyed using liquid nitrogen: Yes    Outcome: patient tolerated procedure well with no complications   Post-procedure details: wound care instructions given   Locations: arms, hands  # of Lesions Treated: 23   INFLAMED SEBORRHEIC KERATOSIS Exam: Erythematous keratotic or waxy stuck-on papule or plaque.  Symptomatic, irritating, patient would like treated.  Benign-appearing.  Call clinic for new or changing lesions.   Prior to procedure, discussed risks of blister formation, small wound, skin dyspigmentation, or rare scar following treatment. Recommend Vaseline ointment to treated areas while healing.  Destruction Procedure Note Destruction method: cryotherapy   Informed consent: discussed and consent obtained   Lesion destroyed using liquid nitrogen: Yes   Outcome: patient tolerated procedure well with no complications   Post-procedure details: wound care instructions given   Locations: forehead x 1, right temple x 1 # of Lesions Treated: 2   ROSACEA Exam Mid face erythema with telangiectasias Well controlled  Rosacea is a chronic progressive skin condition usually affecting the face of adults, causing redness and/or acne bumps. It is treatable but not curable. It sometimes affects the eyes (ocular rosacea) as well. It may respond to topical and/or systemic medication and can flare with stress, sun exposure, alcohol, exercise, topical steroids (including hydrocortisone/cortisone 10) and some foods.  Daily application of broad spectrum spf 30+ sunscreen to face is recommended to reduce flares.  Treatment Plan No treatment needed    SEBORRHEIC KERATOSIS - Stuck-on, waxy, tan-brown papules and/or plaques  - Benign-appearing - Discussed benign etiology and prognosis. - Observe - Call for any changes  ACTINIC DAMAGE - chronic, secondary to cumulative UV radiation exposure/sun exposure over time - diffuse scaly erythematous macules with underlying dyspigmentation - Recommend daily broad spectrum sunscreen  SPF 30+  to sun-exposed areas, reapply every 2 hours as needed.  - Recommend staying in the shade or wearing long sleeves, sun glasses (UVA+UVB protection) and wide brim hats (4-inch brim around the entire circumference of the hat). - Call for new or changing lesions.  Return in about 6 months (around 02/09/2023) for TBSE, hx of skin cancer, hx of AKs .  IAngelique Holm, Monika Farrish, CMA, am acting as scribe for Armida Sansavid Veera Stapleton, MD .   Documentation: I have reviewed the above documentation for accuracy and completeness, and I agree with the above.  Armida Sansavid Seraj Dunnam, MD

## 2022-08-10 NOTE — Patient Instructions (Addendum)
Cryotherapy Aftercare  Wash gently with soap and water everyday.   Apply Vaseline and Band-Aid daily until healed.     Due to recent changes in healthcare laws, you may see results of your pathology and/or laboratory studies on MyChart before the doctors have had a chance to review them. We understand that in some cases there may be results that are confusing or concerning to you. Please understand that not all results are received at the same time and often the doctors may need to interpret multiple results in order to provide you with the best plan of care or course of treatment. Therefore, we ask that you please give us 2 business days to thoroughly review all your results before contacting the office for clarification. Should we see a critical lab result, you will be contacted sooner.   If You Need Anything After Your Visit  If you have any questions or concerns for your doctor, please call our main line at [phone] and press option 4 to reach your doctor's medical assistant. If no one answers, please leave a voicemail as directed and we will return your call as soon as possible. Messages left after 4 pm will be answered the following business day.   You may also send us a message via MyChart. We typically respond to MyChart messages within 1-2 business days.  For prescription refills, please ask your pharmacy to contact our office. Our fax number is [phone].  If you have an urgent issue when the clinic is closed that cannot wait until the next business day, you can page your doctor at the number below.    Please note that while we do our best to be available for urgent issues outside of office hours, we are not available 24/7.   If you have an urgent issue and are unable to reach us, you may choose to seek medical care at your doctor's office, retail clinic, urgent care center, or emergency room.  If you have a medical emergency, please immediately call 911 or go to the  emergency department.  Pager Numbers  - Dr. Kowalski: [phone]  - Dr. Moye: [phone]  - Dr. Stewart: [phone]  In the event of inclement weather, please call our main line at [phone] for an update on the status of any delays or closures.  Dermatology Medication Tips: Please keep the boxes that topical medications come in in order to help keep track of the instructions about where and how to use these. Pharmacies typically print the medication instructions only on the boxes and not directly on the medication tubes.   If your medication is too expensive, please contact our office at [phone] option 4 or send us a message through MyChart.   We are unable to tell what your co-pay for medications will be in advance as this is different depending on your insurance coverage. However, we may be able to find a substitute medication at lower cost or fill out paperwork to get insurance to cover a needed medication.   If a prior authorization is required to get your medication covered by your insurance company, please allow us 1-2 business days to complete this process.  Drug prices often vary depending on where the prescription is filled and some pharmacies may offer cheaper prices.  The website www.goodrx.com contains coupons for medications through different pharmacies. The prices here do not account for what the cost may be with help from insurance (it may be cheaper with your insurance), but the website can   give you the price if you did not use any insurance.  - You can print the associated coupon and take it with your prescription to the pharmacy.  - You may also stop by our office during regular business hours and pick up a GoodRx coupon card.  - If you need your prescription sent electronically to a different pharmacy, notify our office through [facility] MyChart or by phone at [phone] option 4.     Si Usted Necesita Algo Despus de Su Visita  Tambin puede  enviarnos un mensaje a travs de MyChart. Por lo general respondemos a los mensajes de MyChart en el transcurso de 1 a 2 das hbiles.  Para renovar recetas, por favor pida a su farmacia que se ponga en contacto con nuestra oficina. Nuestro nmero de fax es el [phone].  Si tiene un asunto urgente cuando la clnica est cerrada y que no puede esperar hasta el siguiente da hbil, puede llamar/localizar a su doctor(a) al nmero que aparece a continuacin.   Por favor, tenga en cuenta que aunque hacemos todo lo posible para estar disponibles para asuntos urgentes fuera del horario de oficina, no estamos disponibles las 24 horas del da, los 7 das de la semana.   Si tiene un problema urgente y no puede comunicarse con nosotros, puede optar por buscar atencin mdica  en el consultorio de su doctor(a), en una clnica privada, en un centro de atencin urgente o en una sala de emergencias.  Si tiene una emergencia mdica, por favor llame inmediatamente al 911 o vaya a la sala de emergencias.  Nmeros de bper  - Dr. Kowalski: [phone]  - Dra. Moye: [phone]  - Dra. Stewart: [phone]  En caso de inclemencias del tiempo, por favor llame a nuestra lnea principal al [phone] para una actualizacin sobre el estado de cualquier retraso o cierre.  Consejos para la medicacin en dermatologa: Por favor, guarde las cajas en las que vienen los medicamentos de uso tpico para ayudarle a seguir las instrucciones sobre dnde y cmo usarlos. Las farmacias generalmente imprimen las instrucciones del medicamento slo en las cajas y no directamente en los tubos del medicamento.   Si su medicamento es muy caro, por favor, pngase en contacto con nuestra oficina llamando al [phone] y presione la opcin 4 o envenos un mensaje a travs de MyChart.   No podemos decirle cul ser su copago por los medicamentos por adelantado ya que esto es diferente dependiendo de la cobertura de su seguro.  Sin embargo, es posible que podamos encontrar un medicamento sustituto a menor costo o llenar un formulario para que el seguro cubra el medicamento que se considera necesario.   Si se requiere una autorizacin previa para que su compaa de seguros cubra su medicamento, por favor permtanos de 1 a 2 das hbiles para completar este proceso.  Los precios de los medicamentos varan con frecuencia dependiendo del lugar de dnde se surte la receta y alguna farmacias pueden ofrecer precios ms baratos.  El sitio web www.goodrx.com tiene cupones para medicamentos de diferentes farmacias. Los precios aqu no tienen en cuenta lo que podra costar con la ayuda del seguro (puede ser ms barato con su seguro), pero el sitio web puede darle el precio si no utiliz ningn seguro.  - Puede imprimir el cupn correspondiente y llevarlo con su receta a la farmacia.  - Tambin puede pasar por nuestra oficina durante el horario de atencin regular y recoger una tarjeta de cupones de GoodRx.  -   Si necesita que su receta se enve electrnicamente a una farmacia diferente, informe a nuestra oficina a travs de MyChart de [facility] o por telfono llamando al [phone] y presione la opcin 4.  

## 2022-08-14 NOTE — Telephone Encounter (Signed)
Records were received and scanned int Media.

## 2022-08-17 NOTE — Telephone Encounter (Signed)
Thank you for letting me know.  I have been able to review the most recent scanned documentation.  Patient has a history of multiple colon polyps back in 2012 that were adenomatous and also has multiple second-degree or third-degree relatives with history of colon cancer and colon polyps.  2015 colonoscopy report shows evidence of tubular adenoma and sessile serrated polyps  2015 colonoscopy for diminutive polyps in the descending colon.  Resected and retrieved. 3 small polyps in the sigmoid colon.  Resected and retrieved. The examination was otherwise normal. Internal hemorrhoids.  2019 colonoscopy Preparation was excellent. Internal hemorrhoids were found. The exam was otherwise without abnormality. Repeat colonoscopy in 5 years for adenoma surveillance recommended.   Based on the patient's history of precancerous polyps, and family history of multiple relatives, he does have need for recall at 5 years and should remain on such.  He can come in for direct colonoscopy for surveillance.  If he wants to be seen in clinic, he can be seen by me if necessary otherwise direct procedure. Thanks. GM

## 2022-08-18 NOTE — Telephone Encounter (Signed)
Called patient to schedule, she said she will call back.

## 2022-08-19 ENCOUNTER — Encounter: Payer: Self-pay | Admitting: Dermatology

## 2022-08-19 ENCOUNTER — Encounter: Payer: Self-pay | Admitting: Gastroenterology

## 2022-08-20 ENCOUNTER — Ambulatory Visit: Payer: Medicare Other | Admitting: Dermatology

## 2022-09-07 ENCOUNTER — Ambulatory Visit (AMBULATORY_SURGERY_CENTER): Payer: Medicare HMO | Admitting: *Deleted

## 2022-09-07 VITALS — Ht 65.0 in | Wt 150.0 lb

## 2022-09-07 DIAGNOSIS — Z86010 Personal history of colonic polyps: Secondary | ICD-10-CM

## 2022-09-07 NOTE — Progress Notes (Signed)
Pt's name and DOB verified at the beginning of the pre-visit.  Pt denies any difficulty with ambulating,sitting, laying down or rolling side to side Gave both [facility] main # and MD on call # prior to instructions.  No egg or soy allergy known to patient  No issues known to pt with past sedation with any surgeries or procedures Pt denies having issues being intubated Pt has no issues moving head neck or swallowing No FH of Malignant Hyperthermia Pt is not on diet pills Pt is not on home 02  Pt is not on blood thinners  Pt denies issues with constipation  Pt is not on dialysis Pt denies any upcoming cardiac testing Pt encouraged to use to use Singlecare or Goodrx to reduce cost  Patient's chart reviewed by John Nulty CNRA prior to pre-visit and patient appropriate for the [facility].  Pre-visit completed and red dot placed by patient's name on their procedure day (on provider's schedule).  . Visit by phone Pt states weight is 150lb Instructed pt why it is important to and  to call if they have any changes in health or new medications. Directed them to the # given and on instructions.   Pt states they will.  Instructions reviewed with pt and pt states understanding. Instructed to review again prior to procedure. Pt states they will.  Instructions sent by mail with coupon and by my chart   

## 2022-09-22 ENCOUNTER — Encounter: Payer: Self-pay | Admitting: Gastroenterology

## 2022-10-07 ENCOUNTER — Ambulatory Visit (AMBULATORY_SURGERY_CENTER): Payer: Medicare HMO | Admitting: Gastroenterology

## 2022-10-07 ENCOUNTER — Encounter: Payer: Self-pay | Admitting: Gastroenterology

## 2022-10-07 VITALS — BP 116/74 | HR 58 | Temp 97.8°F | Resp 13 | Ht 65.0 in | Wt 150.0 lb

## 2022-10-07 DIAGNOSIS — Z86010 Personal history of colonic polyps: Secondary | ICD-10-CM | POA: Diagnosis not present

## 2022-10-07 DIAGNOSIS — Z09 Encounter for follow-up examination after completed treatment for conditions other than malignant neoplasm: Secondary | ICD-10-CM

## 2022-10-07 DIAGNOSIS — D127 Benign neoplasm of rectosigmoid junction: Secondary | ICD-10-CM

## 2022-10-07 DIAGNOSIS — K635 Polyp of colon: Secondary | ICD-10-CM

## 2022-10-07 MED ORDER — SODIUM CHLORIDE 0.9 % IV SOLN
500.0000 mL | Freq: Once | INTRAVENOUS | Status: DC
Start: 2022-10-07 — End: 2022-10-07

## 2022-10-07 NOTE — Patient Instructions (Signed)
Please read handouts provided. Continue present medications. Await pathology results. High Fiber Diet. Use FiberCon 1-2 tablets daily. Repeat colonoscopy in 5 years for screening.   YOU HAD AN ENDOSCOPIC PROCEDURE TODAY AT THE [facility]:   Refer to the procedure report that was given to you for any specific questions about what was found during the examination.  If the procedure report does not answer your questions, please call your gastroenterologist to clarify.  If you requested that your care partner not be given the details of your procedure findings, then the procedure report has been included in a sealed envelope for you to review at your convenience later.  YOU SHOULD EXPECT: Some feelings of bloating in the abdomen. Passage of more gas than usual.  Walking can help get rid of the air that was put into your GI tract during the procedure and reduce the bloating. If you had a lower endoscopy (such as a colonoscopy or flexible sigmoidoscopy) you may notice spotting of blood in your stool or on the toilet paper. If you underwent a bowel prep for your procedure, you may not have a normal bowel movement for a few days.  Please Note:  You might notice some irritation and congestion in your nose or some drainage.  This is from the oxygen used during your procedure.  There is no need for concern and it should clear up in a day or so.  SYMPTOMS TO REPORT IMMEDIATELY:  Following lower endoscopy (colonoscopy or flexible sigmoidoscopy):  Excessive amounts of blood in the stool  Significant tenderness or worsening of abdominal pains  Swelling of the abdomen that is new, acute  Fever of 100F or higher  For urgent or emergent issues, a gastroenterologist can be reached at any hour by calling [phone]. Do not use MyChart messaging for urgent concerns.    DIET:  We do recommend a small meal at first, but then you may proceed to your regular diet.  Drink plenty of fluids but you  should avoid alcoholic beverages for 24 hours.  ACTIVITY:  You should plan to take it easy for the rest of today and you should NOT DRIVE or use heavy machinery until tomorrow (because of the sedation medicines used during the test).    FOLLOW UP: Our staff will call the number listed on your records the next business day following your procedure.  We will call around 7:15- 8:00 am to check on you and address any questions or concerns that you may have regarding the information given to you following your procedure. If we do not reach you, we will leave a message.     If any biopsies were taken you will be contacted by phone or by letter within the next 1-3 weeks.  Please call us at [phone] if you have not heard about the biopsies in 3 weeks.    SIGNATURES/CONFIDENTIALITY: You and/or your care partner have signed paperwork which will be entered into your electronic medical record.  These signatures attest to the fact that that the information above on your After Visit Summary has been reviewed and is understood.  Full responsibility of the confidentiality of this discharge information lies with you and/or your care-partner.

## 2022-10-07 NOTE — Progress Notes (Signed)
Pt's states no medical or surgical changes since previsit or office visit. 

## 2022-10-07 NOTE — Op Note (Signed)
[facility] Patient Name: Diana Hart Procedure Date: 10/07/2022 1:20 PM MRN: [id number] Endoscopist: Corliss Parish , MD, [id number] Age: 72 Referring MD:  Date of Birth: [birth year] Gender: Female Account #: [id number] Procedure:                Colonoscopy Indications:              Screening for colon cancer: Family history of                            colorectal cancer in multiple 2nd degree relatives,                            High risk colon cancer surveillance: Personal                            history of colonic polyps Medicines:                Monitored Anesthesia Care Procedure:                Pre-Anesthesia Assessment:                           - Prior to the procedure, a History and Physical                            was performed, and patient medications and                            allergies were reviewed. The patient's tolerance of                            previous anesthesia was also reviewed. The risks                            and benefits of the procedure and the sedation                            options and risks were discussed with the patient.                            All questions were answered, and informed consent                            was obtained. Prior Anticoagulants: The patient has                            taken no anticoagulant or antiplatelet agents                            except for aspirin. ASA Grade Assessment: III - A                            patient with severe systemic disease. After  reviewing the risks and benefits, the patient was                            deemed in satisfactory condition to undergo the                            procedure.                           After obtaining informed consent, the colonoscope                            was passed under direct vision. Throughout the                            procedure, the patient's blood pressure, pulse, and                             oxygen saturations were monitored continuously. The                            Olympus PCF-H190DL (#[id number]) Colonoscope was                            introduced through the anus and advanced to the 3                            cm into the ileum. The colonoscopy was performed                            without difficulty. The patient tolerated the                            procedure. The quality of the bowel preparation was                            good. The terminal ileum, ileocecal valve,                            appendiceal orifice, and rectum were photographed. Scope In: 1:22:56 PM Scope Out: 1:34:04 PM Scope Withdrawal Time: 0 hours 8 minutes 10 seconds  Total Procedure Duration: 0 hours 11 minutes 8 seconds  Findings:                 The digital rectal exam findings include                            hemorrhoids. Pertinent negatives include no                            palpable rectal lesions.                           The terminal ileum and ileocecal valve appeared  normal.                           A 3 mm polyp was found in the recto-sigmoid colon.                            The polyp was sessile. The polyp was removed with a                            cold snare. Resection and retrieval were complete.                           Normal mucosa was found in the entire colon                            otherwise.                           Non-bleeding non-thrombosed external and internal                            hemorrhoids were found during retroflexion, during                            perianal exam and during digital exam. The                            hemorrhoids were Grade II (internal hemorrhoids                            that prolapse but reduce spontaneously). Complications:            No immediate complications. Estimated Blood Loss:     Estimated blood loss was minimal. Impression:               - Hemorrhoids found on digital  rectal exam.                           - The examined portion of the ileum was normal.                           - One 3 mm polyp at the recto-sigmoid colon,                            removed with a cold snare. Resected and retrieved.                           - Normal mucosa in the entire examined colon                            otherwise .                           - Non-bleeding non-thrombosed external and internal  hemorrhoids. Recommendation:           - The patient will be observed post-procedure,                            until all discharge criteria are met.                           - Discharge patient to home.                           - Patient has a contact number available for                            emergencies. The signs and symptoms of potential                            delayed complications were discussed with the                            patient. Return to normal activities tomorrow.                            Written discharge instructions were provided to the                            patient.                           - High fiber diet.                           - Use FiberCon 1-2 tablets PO daily.                           - Continue present medications.                           - Await pathology results.                           - Repeat colonoscopy in 5 years for surveillance                            (no matter pathology as a result of family history).                           - The findings and recommendations were discussed                            with the patient.                           - The findings and recommendations were discussed                            with the patient's family. Vicente Serene  Mansouraty, MD 10/07/2022 1:37:51 PM

## 2022-10-07 NOTE — Progress Notes (Signed)
GASTROENTEROLOGY PROCEDURE H&P NOTE   Primary Care Physician: Danella Penton, MD  HPI: Diana Hart is a 72 y.o. female who presents for Colonoscopy for high risk screening/s colon cancer.  Urveillance of previous polyps and family history of colon cancer.  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     bisoprolol-hydrochlorothiazide (ZIAC) 5-6.25 MG tablet TAKE 1 TABLET BY MOUTH DAILY USUALLY IN THE MORNING     lisinopril (ZESTRIL) 20 MG tablet TAKE 1 TABLET BY MOUTH DAILY     Melatonin 1 MG CAPS Take 20 mg by mouth.     calcium carbonate 100 mg/ml SUSP Take 1,200 mg/kg of elemental calcium by mouth.     celecoxib (CELEBREX) 200 MG capsule Take by mouth.     Cholecalciferol (VITAMIN D3) 50 MCG (2000 UT) capsule Take by mouth.     temazepam (RESTORIL) 15 MG capsule TAKE 1 CAPSULE BY MOUTH NIGHTLY AS NEEDED FOR SLEEP     Current Facility-Administered Medications  Medication Dose Route Frequency Provider Last Rate Last Admin   0.9 %  sodium chloride infusion  500 mL Intravenous Once Mansouraty, Netty Starring., MD        Current Outpatient Medications:    ALPRAZolam (XANAX) 0.25 MG tablet, TAKE 1 TABLET TWICE A DAY AS NEEDED, Disp: , Rfl:    aspirin EC 81 MG tablet, Take by mouth., Disp: , Rfl:    bisoprolol-hydrochlorothiazide (ZIAC) 5-6.25 MG tablet, TAKE 1 TABLET BY MOUTH DAILY USUALLY IN  THE MORNING, Disp: , Rfl:    lisinopril (ZESTRIL) 20 MG tablet, TAKE 1 TABLET BY MOUTH DAILY, Disp: , Rfl:    Melatonin 1 MG CAPS, Take 20 mg by mouth., Disp: , Rfl:    calcium carbonate 100 mg/ml SUSP, Take 1,200 mg/kg of elemental calcium by mouth., Disp: , Rfl:    celecoxib (CELEBREX) 200 MG capsule, Take by mouth., Disp: , Rfl:    Cholecalciferol (VITAMIN D3) 50 MCG (2000 UT) capsule, Take by mouth., Disp: , Rfl:    temazepam (RESTORIL) 15 MG capsule, TAKE 1 CAPSULE BY MOUTH NIGHTLY AS NEEDED FOR SLEEP, Disp: , Rfl:   Current Facility-Administered Medications:    0.9 %  sodium chloride infusion, 500 mL, Intravenous, Once, Mansouraty, Netty Starring., MD Allergies  Allergen Reactions   Codeine Itching   Family History  Problem Relation Age of Onset   Colon polyps Cousin    Colon cancer Neg Hx    Esophageal cancer Neg Hx    Rectal cancer Neg Hx    Stomach cancer Neg Hx    Social History   Socioeconomic History   Marital status: Married    Spouse name: Not on file   Number of children:  Not on file   Years of education: Not on file   Highest education level: Not on file  Occupational History   Not on file  Tobacco Use   Smoking status: Former    Types: Cigarettes    Quit date: 09/03/2019    Years since quitting: 3.0   Smokeless tobacco: Never  Vaping Use   Vaping Use: Never used  Substance and Sexual Activity   Alcohol use: Yes    Comment: Glass of wine QD   Drug use: Not Currently    Types: Marijuana   Sexual activity: Not on file  Other Topics Concern   Not on file  Social History Narrative   Not on file   Social Determinants of Health   Financial Resource Strain: Not on file  Food Insecurity: Not on file  Transportation Needs: Not on file  Physical Activity: Not on file  Stress: Not on file  Social Connections: Not on file  Intimate Partner Violence: Not on file    Physical Exam: Today's Vitals   10/07/22 1235 10/07/22 1239  BP: (!) 141/89   Pulse: 60    Temp:  97.8 F (36.6 C)  SpO2: 97%   Weight: 150 lb (68 kg)   Height: 5\' 5"  (1.651 m)    Body mass index is 24.96 kg/m. GEN: NAD EYE: Sclerae anicteric ENT: MMM CV: Non-tachycardic GI: Soft, NT/ND NEURO:  Alert & Oriented x 3  Lab Results: No results for input(s): "WBC", "HGB", "HCT", "PLT" in the last 72 hours. BMET No results for input(s): "NA", "K", "CL", "CO2", "GLUCOSE", "BUN", "CREATININE", "CALCIUM" in the last 72 hours. LFT No results for input(s): "PROT", "ALBUMIN", "AST", "ALT", "ALKPHOS", "BILITOT", "BILIDIR", "IBILI" in the last 72 hours. PT/INR No results for input(s): "LABPROT", "INR" in the last 72 hours.   Impression / Plan: This is a 72 y.o.female who presents for Colonoscopy for high risk screening/s colon cancer.  Urveillance of previous polyps and family history of colon cancer.   The risks and benefits of endoscopic evaluation/treatment were discussed with the patient and/or family; these include but are not limited to the risk of perforation, infection, bleeding, missed lesions, lack of diagnosis, severe illness requiring hospitalization, as well as anesthesia and sedation related illnesses.  The patient's history has been reviewed, patient examined, no change in status, and deemed stable for procedure.  The patient and/or family is agreeable to proceed.    Corliss Parish, MD [facility] Office # [phone]

## 2022-10-07 NOTE — Progress Notes (Signed)
Vss nad trans to pacu 

## 2022-10-08 ENCOUNTER — Telehealth: Payer: Self-pay | Admitting: *Deleted

## 2022-10-08 NOTE — Telephone Encounter (Signed)
  Follow up Call-    Row Labels 10/07/2022   12:39 PM  Call back number   Section Header. No data exists in this row.   Post procedure Call Back phone  #   [phone]  Permission to leave phone message   Yes     Patient questions:  Do you have a fever, pain , or abdominal swelling? No. Pain Score  0 *  Have you tolerated food without any problems? Yes.    Have you been able to return to your normal activities? Yes.    Do you have any questions about your discharge instructions: Diet   No. Medications  No. Follow up visit  No.  Do you have questions or concerns about your Care? No.  Actions: * If pain score is 4 or above: No action needed, pain <4.

## 2022-10-13 ENCOUNTER — Encounter: Payer: Self-pay | Admitting: Gastroenterology

## 2023-02-11 ENCOUNTER — Ambulatory Visit: Payer: Medicare HMO | Admitting: Dermatology

## 2023-03-01 ENCOUNTER — Ambulatory Visit: Payer: Medicare HMO | Admitting: Dermatology

## 2023-03-01 DIAGNOSIS — Z1283 Encounter for screening for malignant neoplasm of skin: Secondary | ICD-10-CM

## 2023-03-01 DIAGNOSIS — D229 Melanocytic nevi, unspecified: Secondary | ICD-10-CM

## 2023-03-01 DIAGNOSIS — L570 Actinic keratosis: Secondary | ICD-10-CM | POA: Diagnosis not present

## 2023-03-01 DIAGNOSIS — L821 Other seborrheic keratosis: Secondary | ICD-10-CM

## 2023-03-01 DIAGNOSIS — Z7189 Other specified counseling: Secondary | ICD-10-CM

## 2023-03-01 DIAGNOSIS — L814 Other melanin hyperpigmentation: Secondary | ICD-10-CM

## 2023-03-01 DIAGNOSIS — W908XXA Exposure to other nonionizing radiation, initial encounter: Secondary | ICD-10-CM | POA: Diagnosis not present

## 2023-03-01 DIAGNOSIS — Z85828 Personal history of other malignant neoplasm of skin: Secondary | ICD-10-CM

## 2023-03-01 DIAGNOSIS — L820 Inflamed seborrheic keratosis: Secondary | ICD-10-CM

## 2023-03-01 DIAGNOSIS — L578 Other skin changes due to chronic exposure to nonionizing radiation: Secondary | ICD-10-CM

## 2023-03-01 DIAGNOSIS — D1801 Hemangioma of skin and subcutaneous tissue: Secondary | ICD-10-CM

## 2023-03-01 DIAGNOSIS — Z872 Personal history of diseases of the skin and subcutaneous tissue: Secondary | ICD-10-CM

## 2023-03-01 NOTE — Patient Instructions (Addendum)

## 2023-03-01 NOTE — Progress Notes (Signed)
Follow-Up Visit   Subjective  Diana Hart is a 72 y.o. female who presents for the following: Skin Cancer Screening and Full Body Skin Exam Hx of aks, hx of scc, hx of isks   The patient presents for Total-Body Skin Exam (TBSE) for skin cancer screening and mole check. The patient has spots, moles and lesions to be evaluated, some may be new or changing and the patient may have concern these could be cancer.    The following portions of the chart were reviewed this encounter and updated as appropriate: medications, allergies, medical history  Review of Systems:  No other skin or systemic complaints except as noted in HPI or Assessment and Plan.  Objective  Well appearing patient in no apparent distress; mood and affect are within normal limits.  A full examination was performed including scalp, head, eyes, ears, nose, lips, neck, chest, axillae, abdomen, back, buttocks, bilateral upper extremities, bilateral lower extremities, hands, feet, fingers, toes, fingernails, and toenails. All findings within normal limits unless otherwise noted below.   Relevant physical exam findings are noted in the Assessment and Plan.  b/l arms and face x 20 (20) Erythematous thin papules/macules with gritty scale.   neck and arms  x 5 (5) Erythematous stuck-on, waxy papule or plaque    Assessment & Plan   SKIN CANCER SCREENING PERFORMED TODAY.  ACTINIC DAMAGE - Chronic condition, secondary to cumulative UV/sun exposure - diffuse scaly erythematous macules with underlying dyspigmentation - Recommend daily broad spectrum sunscreen SPF 30+ to sun-exposed areas, reapply every 2 hours as needed.  - Staying in the shade or wearing long sleeves, sun glasses (UVA+UVB protection) and wide brim hats (4-inch brim around the entire circumference of the hat) are also recommended for sun protection.  - Call for new or changing lesions.  LENTIGINES, SEBORRHEIC KERATOSES, HEMANGIOMAS - Benign normal skin  lesions - Benign-appearing - Call for any changes  MELANOCYTIC NEVI - Tan-brown and/or pink-flesh-colored symmetric macules and papules - Benign appearing on exam today - Observation - Call clinic for new or changing moles - Recommend daily use of broad spectrum spf 30+ sunscreen to sun-exposed areas.    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  - Call if any new or changing lesions are noted between office visits  Actinic keratosis (20) b/l arms and face x 20  Actinic keratoses are precancerous spots that appear secondary to cumulative UV radiation exposure/sun exposure over time. They are chronic with expected duration over 1 year. A portion of actinic keratoses will progress to squamous cell carcinoma of the skin. It is not possible to reliably predict which spots will progress to skin cancer and so treatment is recommended to prevent development of skin cancer.  Discussed field treatment with topical 5-FU chemotherapy.  Patient declines  Recommend daily broad spectrum sunscreen SPF 30+ to sun-exposed areas, reapply every 2 hours as needed.  Recommend staying in the shade or wearing long sleeves, sun glasses (UVA+UVB protection) and wide brim hats (4-inch brim around the entire circumference of the hat). Call for new or changing lesions.  Destruction of lesion - b/l arms and face x 20 (20) Complexity: simple   Destruction method: cryotherapy   Informed consent: discussed and consent obtained   Timeout:  patient name, date of birth, surgical site, and procedure verified Lesion destroyed using liquid nitrogen: Yes   Region frozen until ice ball extended beyond lesion: Yes   Outcome: patient tolerated procedure well with no complications    Post-procedure details: wound care instructions given    Inflamed seborrheic keratosis (5) neck and arms  x 5  Symptomatic, irritating, patient would like treated.  Destruction of lesion - neck and arms  x 5 (5) Complexity: simple   Destruction method: cryotherapy   Informed consent: discussed and consent obtained   Timeout:  patient name, date of birth, surgical site, and procedure verified Lesion destroyed using liquid nitrogen: Yes   Region frozen until ice ball extended beyond lesion: Yes   Outcome: patient tolerated procedure well with no complications   Post-procedure details: wound care instructions given     Return for 7 - 8 months tbse.  IAsher Muir, CMA, am acting as scribe for Armida Sans, MD.   Documentation: I have reviewed the above documentation for accuracy and completeness, and I agree with the above.  Armida Sans, MD

## 2023-03-13 ENCOUNTER — Encounter: Payer: Self-pay | Admitting: Dermatology

## 2023-08-31 ENCOUNTER — Other Ambulatory Visit: Payer: Self-pay | Admitting: Internal Medicine

## 2023-08-31 DIAGNOSIS — E782 Mixed hyperlipidemia: Secondary | ICD-10-CM

## 2023-08-31 DIAGNOSIS — Z0000 Encounter for general adult medical examination without abnormal findings: Secondary | ICD-10-CM

## 2023-09-13 ENCOUNTER — Other Ambulatory Visit

## 2023-09-20 ENCOUNTER — Ambulatory Visit
Admission: RE | Admit: 2023-09-20 | Discharge: 2023-09-20 | Disposition: A | Discharge status: Home / Self Care | Payer: Self-pay | Source: Ambulatory Visit | Attending: Internal Medicine | Admitting: Internal Medicine

## 2023-09-20 DIAGNOSIS — Z0000 Encounter for general adult medical examination without abnormal findings: Secondary | ICD-10-CM | POA: Insufficient documentation

## 2023-09-20 DIAGNOSIS — E782 Mixed hyperlipidemia: Secondary | ICD-10-CM | POA: Insufficient documentation

## 2023-10-06 ENCOUNTER — Ambulatory Visit: Payer: Medicare HMO | Admitting: Dermatology

## 2023-10-07 ENCOUNTER — Ambulatory Visit: Admitting: Dermatology

## 2023-10-07 ENCOUNTER — Encounter: Payer: Self-pay | Admitting: Dermatology

## 2023-10-07 DIAGNOSIS — Z8589 Personal history of malignant neoplasm of other organs and systems: Secondary | ICD-10-CM

## 2023-10-07 DIAGNOSIS — L820 Inflamed seborrheic keratosis: Secondary | ICD-10-CM

## 2023-10-07 DIAGNOSIS — Z1283 Encounter for screening for malignant neoplasm of skin: Secondary | ICD-10-CM

## 2023-10-07 DIAGNOSIS — L570 Actinic keratosis: Secondary | ICD-10-CM

## 2023-10-07 DIAGNOSIS — Z85828 Personal history of other malignant neoplasm of skin: Secondary | ICD-10-CM

## 2023-10-07 DIAGNOSIS — D229 Melanocytic nevi, unspecified: Secondary | ICD-10-CM

## 2023-10-07 DIAGNOSIS — L578 Other skin changes due to chronic exposure to nonionizing radiation: Secondary | ICD-10-CM

## 2023-10-07 DIAGNOSIS — W908XXA Exposure to other nonionizing radiation, initial encounter: Secondary | ICD-10-CM

## 2023-10-07 DIAGNOSIS — D1801 Hemangioma of skin and subcutaneous tissue: Secondary | ICD-10-CM

## 2023-10-07 DIAGNOSIS — L814 Other melanin hyperpigmentation: Secondary | ICD-10-CM

## 2023-10-07 DIAGNOSIS — L821 Other seborrheic keratosis: Secondary | ICD-10-CM

## 2023-10-07 NOTE — Patient Instructions (Signed)

## 2023-10-07 NOTE — Progress Notes (Signed)
 Follow-Up Visit   Subjective  Diana Hart is a 73 y.o. female who presents for the following: Skin Cancer Screening and Full Body Skin Exam  The patient presents for Total-Body Skin Exam (TBSE) for skin cancer screening and mole check. The patient has spots, moles and lesions to be evaluated, some may be new or changing and the patient may have concern these could be cancer.  Hx SCC, AK  The following portions of the chart were reviewed this encounter and updated as appropriate: medications, allergies, medical history  Review of Systems:  No other skin or systemic complaints except as noted in HPI or Assessment and Plan.  Objective  Well appearing patient in no apparent distress; mood and affect are within normal limits.  A full examination was performed including scalp, head, eyes, ears, nose, lips, neck, chest, axillae, abdomen, back, buttocks, bilateral upper extremities, bilateral lower extremities, hands, feet, fingers, toes, fingernails, and toenails. All findings within normal limits unless otherwise noted below.   Relevant physical exam findings are noted in the Assessment and Plan.  back x 9, neck x 2, R shoulder x 1, clavicle x 3, L ant thigh x 1 (16) Erythematous stuck-on, waxy papule or plaque arms, hands x 14, face x 5 (19) Erythematous thin papules/macules with gritty scale.   Assessment & Plan   SKIN CANCER SCREENING PERFORMED TODAY.  ACTINIC DAMAGE - Chronic condition, secondary to cumulative UV/sun exposure - diffuse scaly erythematous macules with underlying dyspigmentation - Recommend daily broad spectrum sunscreen SPF 30+ to sun-exposed areas, reapply every 2 hours as needed.  - Staying in the shade or wearing long sleeves, sun glasses (UVA+UVB protection) and wide brim hats (4-inch brim around the entire circumference of the hat) are also recommended for sun protection.  - Call for new or changing lesions.  LENTIGINES, SEBORRHEIC KERATOSES, HEMANGIOMAS -  Benign normal skin lesions - Benign-appearing - Call for any changes  MELANOCYTIC NEVI - Tan-brown and/or pink-flesh-colored symmetric macules and papules - Benign appearing on exam today - Observation - Call clinic for new or changing moles - Recommend daily use of broad spectrum spf 30+ sunscreen to sun-exposed areas.   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  - Call if any new or changing lesions are noted between office visits  INFLAMED SEBORRHEIC KERATOSIS (16) back x 9, neck x 2, R shoulder x 1, clavicle x 3, L ant thigh x 1 (16) Symptomatic, irritating, patient would like treated.  Benign-appearing.  Call clinic for new or changing lesions.   Destruction of lesion - back x 9, neck x 2, R shoulder x 1, clavicle x 3, L ant thigh x 1 (16) Complexity: simple   Destruction method: cryotherapy   Informed consent: discussed and consent obtained   Timeout:  patient name, date of birth, surgical site, and procedure verified Lesion destroyed using liquid nitrogen: Yes   Region frozen until ice ball extended beyond lesion: Yes   Outcome: patient tolerated procedure well with no complications   Post-procedure details: wound care instructions given   AK (ACTINIC KERATOSIS) (19) arms, hands x 14, face x 5 (19) Actinic keratoses are precancerous spots that appear secondary to cumulative UV radiation exposure/sun exposure over time. They are chronic with expected duration over  1 year. A portion of actinic keratoses will progress to squamous cell carcinoma of the skin. It is not possible to reliably predict which spots will progress to skin cancer and so treatment is recommended to prevent development of skin cancer.  Recommend daily broad spectrum  sunscreen SPF 30+ to sun-exposed areas, reapply every 2 hours as needed.  Recommend staying in the shade or wearing long sleeves, sun glasses (UVA+UVB protection) and wide brim hats (4-inch brim around the entire circumference of the hat). Call for new or changing lesions. Destruction of lesion - arms, hands x 14, face x 5 (19) Complexity: simple   Destruction method: cryotherapy   Informed consent: discussed and consent obtained   Timeout:  patient name, date of birth, surgical site, and procedure verified Lesion destroyed using liquid nitrogen: Yes   Region frozen until ice ball extended beyond lesion: Yes   Outcome: patient tolerated procedure well with no complications   Post-procedure details: wound care instructions given   Return in about 7 months (around 05/08/2024) for AK follow up, ISK follow up, with Dr. Almeda Aris, RMA, am acting as scribe for Celine Collard, MD .   Documentation: I have reviewed the above documentation for accuracy and completeness, and I agree with the above.  Celine Collard, MD

## 2024-05-11 ENCOUNTER — Encounter: Payer: Self-pay | Admitting: Dermatology

## 2024-05-11 ENCOUNTER — Ambulatory Visit: Admitting: Dermatology

## 2024-05-11 DIAGNOSIS — Z85828 Personal history of other malignant neoplasm of skin: Secondary | ICD-10-CM | POA: Diagnosis not present

## 2024-05-11 DIAGNOSIS — L570 Actinic keratosis: Secondary | ICD-10-CM | POA: Diagnosis not present

## 2024-05-11 DIAGNOSIS — L820 Inflamed seborrheic keratosis: Secondary | ICD-10-CM | POA: Diagnosis not present

## 2024-05-11 DIAGNOSIS — Z8589 Personal history of malignant neoplasm of other organs and systems: Secondary | ICD-10-CM

## 2024-05-11 DIAGNOSIS — L578 Other skin changes due to chronic exposure to nonionizing radiation: Secondary | ICD-10-CM

## 2024-05-11 DIAGNOSIS — W908XXA Exposure to other nonionizing radiation, initial encounter: Secondary | ICD-10-CM | POA: Diagnosis not present

## 2024-05-11 DIAGNOSIS — L821 Other seborrheic keratosis: Secondary | ICD-10-CM | POA: Diagnosis not present

## 2024-05-11 NOTE — Patient Instructions (Addendum)

## 2024-05-11 NOTE — Progress Notes (Signed)
" ° °  Follow-Up Visit   Subjective  Diana Hart is a 74 y.o. female who presents for the following: 6 months f/u on precancers.  The patient has spots, moles and lesions to be evaluated, some may be new or changing and the patient may have concern these could be cancer.   The following portions of the chart were reviewed this encounter and updated as appropriate: medications, allergies, medical history  Review of Systems:  No other skin or systemic complaints except as noted in HPI or Assessment and Plan.  Objective  Well appearing patient in no apparent distress; mood and affect are within normal limits.  A focused examination was performed of the following areas:face,arms,hands,legs  Relevant exam findings are noted in the Assessment and Plan.  hands,arms face (28) Erythematous thin papules/macules with gritty scale.  face, legs (17) Stuck-on, waxy, tan-brown papule or plaque --Discussed benign etiology and prognosis.   Assessment & Plan   AK (ACTINIC KERATOSIS) (28) hands,arms face (28) ACTINIC DAMAGE - chronic, secondary to cumulative UV radiation exposure/sun exposure over time - diffuse scaly erythematous macules with underlying dyspigmentation - Recommend daily broad spectrum sunscreen SPF 30+ to sun-exposed areas, reapply every 2 hours as needed.  - Recommend staying in the shade or wearing long sleeves, sun glasses (UVA+UVB protection) and wide brim hats (4-inch brim around the entire circumference of the hat). - Call for new or changing lesions.  - Destruction of lesion - hands,arms face (28) Complexity: simple   Destruction method: cryotherapy   Informed consent: discussed and consent obtained   Timeout:  patient name, date of birth, surgical site, and procedure verified Lesion destroyed using liquid nitrogen: Yes   Region frozen until ice ball extended beyond lesion: Yes   Outcome: patient tolerated procedure well with no complications   Post-procedure details: wound  care instructions given    INFLAMED SEBORRHEIC KERATOSIS (17) face, legs (17) Symptomatic, irritating, patient would like treated.  - Destruction of lesion - face, legs (17) Complexity: simple   Destruction method: cryotherapy   Informed consent: discussed and consent obtained   Timeout:  patient name, date of birth, surgical site, and procedure verified Lesion destroyed using liquid nitrogen: Yes   Region frozen until ice ball extended beyond lesion: Yes   Outcome: patient tolerated procedure well with no complications   Post-procedure details: wound care instructions given     SEBORRHEIC KERATOSIS - Stuck-on, waxy, tan-brown papules and/or plaques  - Benign-appearing - Discussed benign etiology and prognosis. - Observe - Call for any changes  HISTORY OF SQUAMOUS CELL CARCINOMA OF THE SKIN Left mid medial calf EDC 01/21/22 - No evidence of recurrence today - No lymphadenopathy - Recommend regular full body skin exams - Recommend daily broad spectrum sunscreen SPF 30+ to sun-exposed areas, reapply every 2 hours as needed.  - Call if any new or changing lesions are noted between office visits    Return in about 6 months (around 11/08/2024) for TBSE, hx of SCC, hx of AKs .  IFay Kirks, CMA, am acting as scribe for Alm Rhyme, MD .   Documentation: I have reviewed the above documentation for accuracy and completeness, and I agree with the above.  Alm Rhyme, MD    "

## 2024-11-14 ENCOUNTER — Ambulatory Visit: Admitting: Dermatology
